# Patient Record
Sex: Male | Born: 1955 | Smoking: Current every day smoker
Health system: Southern US, Community
[De-identification: ages and names within clinical notes are randomized; demographics above are authoritative.]

---

## 2019-03-16 ENCOUNTER — Inpatient Hospital Stay
Admission: EM | Admit: 2019-03-16 | Discharge: 2019-03-31 | DRG: 871 | Disposition: E | Payer: Medicaid Other | Attending: Internal Medicine | Admitting: Internal Medicine

## 2019-03-16 ENCOUNTER — Emergency Department: Payer: Medicaid Other

## 2019-03-16 ENCOUNTER — Other Ambulatory Visit: Payer: Self-pay

## 2019-03-16 DIAGNOSIS — Z888 Allergy status to other drugs, medicaments and biological substances status: Secondary | ICD-10-CM

## 2019-03-16 DIAGNOSIS — R4182 Altered mental status, unspecified: Secondary | ICD-10-CM

## 2019-03-16 DIAGNOSIS — Z66 Do not resuscitate: Secondary | ICD-10-CM | POA: Diagnosis not present

## 2019-03-16 DIAGNOSIS — E872 Acidosis: Secondary | ICD-10-CM

## 2019-03-16 DIAGNOSIS — L03113 Cellulitis of right upper limb: Secondary | ICD-10-CM | POA: Diagnosis present

## 2019-03-16 DIAGNOSIS — G9341 Metabolic encephalopathy: Secondary | ICD-10-CM | POA: Diagnosis present

## 2019-03-16 DIAGNOSIS — E43 Unspecified severe protein-calorie malnutrition: Secondary | ICD-10-CM | POA: Diagnosis present

## 2019-03-16 DIAGNOSIS — K567 Ileus, unspecified: Secondary | ICD-10-CM

## 2019-03-16 DIAGNOSIS — M868X2 Other osteomyelitis, upper arm: Secondary | ICD-10-CM | POA: Diagnosis present

## 2019-03-16 DIAGNOSIS — R7881 Bacteremia: Secondary | ICD-10-CM

## 2019-03-16 DIAGNOSIS — T148XXA Other injury of unspecified body region, initial encounter: Secondary | ICD-10-CM

## 2019-03-16 DIAGNOSIS — R570 Cardiogenic shock: Secondary | ICD-10-CM | POA: Diagnosis present

## 2019-03-16 DIAGNOSIS — F17210 Nicotine dependence, cigarettes, uncomplicated: Secondary | ICD-10-CM | POA: Diagnosis present

## 2019-03-16 DIAGNOSIS — A4102 Sepsis due to Methicillin resistant Staphylococcus aureus: Secondary | ICD-10-CM | POA: Diagnosis present

## 2019-03-16 DIAGNOSIS — Q87410 Marfan's syndrome with aortic dilation: Secondary | ICD-10-CM

## 2019-03-16 DIAGNOSIS — R402412 Glasgow coma scale score 13-15, at arrival to emergency department: Secondary | ICD-10-CM | POA: Diagnosis present

## 2019-03-16 DIAGNOSIS — Z978 Presence of other specified devices: Secondary | ICD-10-CM

## 2019-03-16 DIAGNOSIS — Z01818 Encounter for other preprocedural examination: Secondary | ICD-10-CM

## 2019-03-16 DIAGNOSIS — Z79899 Other long term (current) drug therapy: Secondary | ICD-10-CM

## 2019-03-16 DIAGNOSIS — A0472 Enterocolitis due to Clostridium difficile, not specified as recurrent: Secondary | ICD-10-CM

## 2019-03-16 DIAGNOSIS — Z7189 Other specified counseling: Secondary | ICD-10-CM

## 2019-03-16 DIAGNOSIS — Z6823 Body mass index (BMI) 23.0-23.9, adult: Secondary | ICD-10-CM

## 2019-03-16 DIAGNOSIS — R64 Cachexia: Secondary | ICD-10-CM | POA: Diagnosis present

## 2019-03-16 DIAGNOSIS — I7103 Dissection of thoracoabdominal aorta: Secondary | ICD-10-CM | POA: Diagnosis present

## 2019-03-16 DIAGNOSIS — M069 Rheumatoid arthritis, unspecified: Secondary | ICD-10-CM | POA: Diagnosis present

## 2019-03-16 DIAGNOSIS — J9602 Acute respiratory failure with hypercapnia: Secondary | ICD-10-CM | POA: Diagnosis not present

## 2019-03-16 DIAGNOSIS — J690 Pneumonitis due to inhalation of food and vomit: Secondary | ICD-10-CM | POA: Diagnosis present

## 2019-03-16 DIAGNOSIS — R6521 Severe sepsis with septic shock: Secondary | ICD-10-CM | POA: Diagnosis present

## 2019-03-16 DIAGNOSIS — R627 Adult failure to thrive: Secondary | ICD-10-CM | POA: Diagnosis present

## 2019-03-16 DIAGNOSIS — L8990 Pressure ulcer of unspecified site, unspecified stage: Secondary | ICD-10-CM

## 2019-03-16 DIAGNOSIS — A419 Sepsis, unspecified organism: Secondary | ICD-10-CM

## 2019-03-16 DIAGNOSIS — Z1389 Encounter for screening for other disorder: Secondary | ICD-10-CM

## 2019-03-16 DIAGNOSIS — M00021 Staphylococcal arthritis, right elbow: Secondary | ICD-10-CM | POA: Diagnosis present

## 2019-03-16 DIAGNOSIS — F1020 Alcohol dependence, uncomplicated: Secondary | ICD-10-CM | POA: Diagnosis present

## 2019-03-16 DIAGNOSIS — D649 Anemia, unspecified: Secondary | ICD-10-CM | POA: Diagnosis present

## 2019-03-16 DIAGNOSIS — R52 Pain, unspecified: Secondary | ICD-10-CM

## 2019-03-16 DIAGNOSIS — Z0189 Encounter for other specified special examinations: Secondary | ICD-10-CM

## 2019-03-16 DIAGNOSIS — I5021 Acute systolic (congestive) heart failure: Secondary | ICD-10-CM | POA: Diagnosis not present

## 2019-03-16 DIAGNOSIS — H9213 Otorrhea, bilateral: Secondary | ICD-10-CM | POA: Diagnosis present

## 2019-03-16 DIAGNOSIS — Z8619 Personal history of other infectious and parasitic diseases: Secondary | ICD-10-CM

## 2019-03-16 DIAGNOSIS — Z452 Encounter for adjustment and management of vascular access device: Secondary | ICD-10-CM

## 2019-03-16 DIAGNOSIS — Z981 Arthrodesis status: Secondary | ICD-10-CM

## 2019-03-16 DIAGNOSIS — Z515 Encounter for palliative care: Secondary | ICD-10-CM

## 2019-03-16 DIAGNOSIS — R571 Hypovolemic shock: Secondary | ICD-10-CM | POA: Diagnosis present

## 2019-03-16 DIAGNOSIS — I083 Combined rheumatic disorders of mitral, aortic and tricuspid valves: Secondary | ICD-10-CM | POA: Diagnosis present

## 2019-03-16 DIAGNOSIS — Z20828 Contact with and (suspected) exposure to other viral communicable diseases: Secondary | ICD-10-CM | POA: Diagnosis present

## 2019-03-16 DIAGNOSIS — F1910 Other psychoactive substance abuse, uncomplicated: Secondary | ICD-10-CM | POA: Diagnosis present

## 2019-03-16 DIAGNOSIS — B9562 Methicillin resistant Staphylococcus aureus infection as the cause of diseases classified elsewhere: Secondary | ICD-10-CM

## 2019-03-16 DIAGNOSIS — N179 Acute kidney failure, unspecified: Secondary | ICD-10-CM

## 2019-03-16 DIAGNOSIS — J9601 Acute respiratory failure with hypoxia: Secondary | ICD-10-CM | POA: Diagnosis not present

## 2019-03-16 DIAGNOSIS — F10231 Alcohol dependence with withdrawal delirium: Secondary | ICD-10-CM | POA: Diagnosis not present

## 2019-03-16 DIAGNOSIS — J9600 Acute respiratory failure, unspecified whether with hypoxia or hypercapnia: Secondary | ICD-10-CM

## 2019-03-16 DIAGNOSIS — Z4659 Encounter for fitting and adjustment of other gastrointestinal appliance and device: Secondary | ICD-10-CM

## 2019-03-16 DIAGNOSIS — R6520 Severe sepsis without septic shock: Secondary | ICD-10-CM

## 2019-03-16 LAB — COMPREHENSIVE METABOLIC PANEL
ALT: 18 U/L (ref 0–44)
AST: 57 U/L — ABNORMAL HIGH (ref 15–41)
Albumin: 1.7 g/dL — ABNORMAL LOW (ref 3.5–5.0)
Alkaline Phosphatase: 123 U/L (ref 38–126)
Anion gap: 28 — ABNORMAL HIGH (ref 5–15)
BUN: 58 mg/dL — ABNORMAL HIGH (ref 8–23)
CO2: 11 mmol/L — ABNORMAL LOW (ref 22–32)
Calcium: 7.6 mg/dL — ABNORMAL LOW (ref 8.9–10.3)
Chloride: 92 mmol/L — ABNORMAL LOW (ref 98–111)
Creatinine, Ser: 4.6 mg/dL — ABNORMAL HIGH (ref 0.61–1.24)
GFR calc Af Amer: 15 mL/min — ABNORMAL LOW (ref >60)
GFR calc non Af Amer: 13 mL/min — ABNORMAL LOW (ref >60)
Glucose, Bld: 137 mg/dL — ABNORMAL HIGH (ref 70–99)
Potassium: 2.1 mmol/L — CL (ref 3.5–5.1)
Sodium: 131 mmol/L — ABNORMAL LOW (ref 135–145)
Total Bilirubin: 0.7 mg/dL (ref 0.3–1.2)
Total Protein: 5.1 g/dL — ABNORMAL LOW (ref 6.5–8.1)

## 2019-03-16 LAB — CBC
HCT: 23.5 % — ABNORMAL LOW (ref 39.0–52.0)
Hemoglobin: 8.2 g/dL — ABNORMAL LOW (ref 13.0–17.0)
MCH: 30.9 pg (ref 26.0–34.0)
MCHC: 34.9 g/dL (ref 30.0–36.0)
MCV: 88.7 fL (ref 80.0–100.0)
Platelets: 126 10*3/uL — ABNORMAL LOW (ref 150–400)
RBC: 2.65 MIL/uL — ABNORMAL LOW (ref 4.22–5.81)
RDW: 14.2 % (ref 11.5–15.5)
WBC: 24 10*3/uL — ABNORMAL HIGH (ref 4.0–10.5)
nRBC: 0 % (ref 0.0–0.2)

## 2019-03-16 LAB — CBC WITH DIFFERENTIAL/PLATELET
Abs Immature Granulocytes: 0.84 10*3/uL — ABNORMAL HIGH (ref 0.00–0.07)
Basophils Absolute: 0.1 10*3/uL (ref 0.0–0.1)
Basophils Relative: 0 %
Eosinophils Absolute: 0 10*3/uL (ref 0.0–0.5)
Eosinophils Relative: 0 %
HCT: 27.4 % — ABNORMAL LOW (ref 39.0–52.0)
Hemoglobin: 9.2 g/dL — ABNORMAL LOW (ref 13.0–17.0)
Immature Granulocytes: 3 %
Lymphocytes Relative: 2 %
Lymphs Abs: 0.6 10*3/uL — ABNORMAL LOW (ref 0.7–4.0)
MCH: 31.1 pg (ref 26.0–34.0)
MCHC: 33.6 g/dL (ref 30.0–36.0)
MCV: 92.6 fL (ref 80.0–100.0)
Monocytes Absolute: 0.5 10*3/uL (ref 0.1–1.0)
Monocytes Relative: 2 %
Neutro Abs: 23.5 10*3/uL — ABNORMAL HIGH (ref 1.7–7.7)
Neutrophils Relative %: 93 %
Platelets: 151 10*3/uL (ref 150–400)
RBC: 2.96 MIL/uL — ABNORMAL LOW (ref 4.22–5.81)
RDW: 14.3 % (ref 11.5–15.5)
Smear Review: ADEQUATE
WBC: 25.6 10*3/uL — ABNORMAL HIGH (ref 4.0–10.5)
nRBC: 0.2 % (ref 0.0–0.2)

## 2019-03-16 LAB — URINALYSIS, COMPLETE (UACMP) WITH MICROSCOPIC
Bilirubin Urine: NEGATIVE
Glucose, UA: NEGATIVE mg/dL
Ketones, ur: NEGATIVE mg/dL
Nitrite: NEGATIVE
Protein, ur: 100 mg/dL — AB
Specific Gravity, Urine: 1.017 (ref 1.005–1.030)
WBC, UA: >50 WBC/hpf — ABNORMAL HIGH (ref 0–5)
pH: 5 (ref 5.0–8.0)

## 2019-03-16 LAB — BASIC METABOLIC PANEL
Anion gap: 17 — ABNORMAL HIGH (ref 5–15)
BUN: 62 mg/dL — ABNORMAL HIGH (ref 8–23)
CO2: 17 mmol/L — ABNORMAL LOW (ref 22–32)
Calcium: 7 mg/dL — ABNORMAL LOW (ref 8.9–10.3)
Chloride: 99 mmol/L (ref 98–111)
Creatinine, Ser: 4.28 mg/dL — ABNORMAL HIGH (ref 0.61–1.24)
GFR calc Af Amer: 16 mL/min — ABNORMAL LOW (ref >60)
GFR calc non Af Amer: 14 mL/min — ABNORMAL LOW (ref >60)
Glucose, Bld: 135 mg/dL — ABNORMAL HIGH (ref 70–99)
Potassium: <2 mmol/L — CL (ref 3.5–5.1)
Sodium: 133 mmol/L — ABNORMAL LOW (ref 135–145)

## 2019-03-16 LAB — IRON AND TIBC: Iron: 14 ug/dL — ABNORMAL LOW (ref 45–182)

## 2019-03-16 LAB — FERRITIN: Ferritin: 1271 ng/mL — ABNORMAL HIGH (ref 24–336)

## 2019-03-16 LAB — URINE DRUG SCREEN, QUALITATIVE (ARMC ONLY)
Amphetamines, Ur Screen: NOT DETECTED
Barbiturates, Ur Screen: NOT DETECTED
Benzodiazepine, Ur Scrn: NOT DETECTED
Cannabinoid 50 Ng, Ur ~~LOC~~: POSITIVE — AB
Cocaine Metabolite,Ur ~~LOC~~: NOT DETECTED
MDMA (Ecstasy)Ur Screen: NOT DETECTED
Methadone Scn, Ur: NOT DETECTED
Opiate, Ur Screen: POSITIVE — AB
Phencyclidine (PCP) Ur S: NOT DETECTED
Tricyclic, Ur Screen: NOT DETECTED

## 2019-03-16 LAB — LACTIC ACID, PLASMA
Lactic Acid, Venous: >11 mmol/L (ref 0.5–1.9)
Lactic Acid, Venous: 5.7 mmol/L (ref 0.5–1.9)
Lactic Acid, Venous: 1.9 mmol/L (ref 0.5–1.9)

## 2019-03-16 LAB — GLUCOSE, CAPILLARY: Glucose-Capillary: 154 mg/dL — ABNORMAL HIGH (ref 70–99)

## 2019-03-16 LAB — MAGNESIUM
Magnesium: 2 mg/dL (ref 1.7–2.4)
Magnesium: 1.7 mg/dL (ref 1.7–2.4)

## 2019-03-16 LAB — SARS CORONAVIRUS 2 BY RT PCR (HOSPITAL ORDER, PERFORMED IN ~~LOC~~ HOSPITAL LAB): SARS Coronavirus 2: NEGATIVE

## 2019-03-16 LAB — BLOOD GAS, VENOUS
Patient temperature: 37
pCO2, Ven: 27 mmHg — ABNORMAL LOW (ref 44.0–60.0)
pH, Ven: 7.22 — ABNORMAL LOW (ref 7.250–7.430)
pO2, Ven: 34 mmHg (ref 32.0–45.0)

## 2019-03-16 LAB — MRSA PCR SCREENING: MRSA by PCR: POSITIVE — AB

## 2019-03-16 LAB — FOLATE: Folate: 4.5 ng/mL — ABNORMAL LOW (ref >5.9)

## 2019-03-16 LAB — PROCALCITONIN: Procalcitonin: 3.88 ng/mL

## 2019-03-16 MED ORDER — ACETAMINOPHEN 325 MG PO TABS: 650.0000 mg | ORAL_TABLET | Freq: Four times a day (QID) | ORAL | Status: DC | PRN

## 2019-03-16 MED ORDER — ONDANSETRON HCL 4 MG PO TABS: 4.0000 mg | ORAL_TABLET | Freq: Four times a day (QID) | ORAL | Status: DC | PRN

## 2019-03-16 MED ORDER — POTASSIUM CHLORIDE 10 MEQ/100ML IV SOLN
10.0000 meq | INTRAVENOUS | Status: AC
  Administered 2019-03-16 (×4): 10 meq via INTRAVENOUS
  Filled 2019-03-16 (×4): qty 100

## 2019-03-16 MED ORDER — ADULT MULTIVITAMIN W/MINERALS CH
1.0000 | ORAL_TABLET | Freq: Every day | ORAL | Status: DC
  Administered 2019-03-16: 1 via ORAL
  Filled 2019-03-16: qty 1

## 2019-03-16 MED ORDER — SODIUM CHLORIDE 0.9 % IV SOLN
2.0000 g | Freq: Once | INTRAVENOUS | Status: AC
  Administered 2019-03-16: 2 g via INTRAVENOUS
  Filled 2019-03-16: qty 2

## 2019-03-16 MED ORDER — LORAZEPAM 1 MG PO TABS: 1.0000 mg | ORAL_TABLET | Freq: Four times a day (QID) | ORAL | Status: DC | PRN

## 2019-03-16 MED ORDER — METRONIDAZOLE IN NACL 5-0.79 MG/ML-% IV SOLN
500.0000 mg | Freq: Once | INTRAVENOUS | Status: AC
  Administered 2019-03-16: 500 mg via INTRAVENOUS
  Filled 2019-03-16: qty 100

## 2019-03-16 MED ORDER — VANCOMYCIN HCL IN DEXTROSE 1-5 GM/200ML-% IV SOLN
1000.0000 mg | Freq: Once | INTRAVENOUS | Status: AC
  Administered 2019-03-16: 1000 mg via INTRAVENOUS
  Filled 2019-03-16: qty 200

## 2019-03-16 MED ORDER — ENOXAPARIN SODIUM 30 MG/0.3ML ~~LOC~~ SOLN
30.0000 mg | SUBCUTANEOUS | Status: DC
  Administered 2019-03-16: 30 mg via SUBCUTANEOUS
  Filled 2019-03-16: qty 0.3

## 2019-03-16 MED ORDER — THIAMINE HCL 100 MG/ML IJ SOLN: 100.0000 mg | Freq: Every day | INTRAMUSCULAR | Status: DC

## 2019-03-16 MED ORDER — SODIUM CHLORIDE 0.9 % IV BOLUS
500.0000 mL | Freq: Once | INTRAVENOUS | Status: AC
  Administered 2019-03-16: 500 mL via INTRAVENOUS

## 2019-03-16 MED ORDER — VANCOMYCIN HCL IN DEXTROSE 1-5 GM/200ML-% IV SOLN: 1000.0000 mg | Freq: Once | INTRAVENOUS | Status: DC

## 2019-03-16 MED ORDER — ONDANSETRON HCL 4 MG/2ML IJ SOLN: 4.0000 mg | Freq: Four times a day (QID) | INTRAMUSCULAR | Status: DC | PRN

## 2019-03-16 MED ORDER — MORPHINE SULFATE (PF) 2 MG/ML IV SOLN
1.0000 mg | INTRAVENOUS | Status: DC | PRN
  Administered 2019-03-16 – 2019-03-17 (×3): 2 mg via INTRAVENOUS
  Filled 2019-03-16 (×3): qty 1

## 2019-03-16 MED ORDER — FLUOXETINE HCL 20 MG PO CAPS
40.0000 mg | ORAL_CAPSULE | Freq: Every day | ORAL | Status: DC
  Filled 2019-03-16: qty 2

## 2019-03-16 MED ORDER — SODIUM CHLORIDE 0.9 % IV SOLN
2.0000 g | INTRAVENOUS | Status: DC
  Filled 2019-03-16: qty 2

## 2019-03-16 MED ORDER — SODIUM CHLORIDE 0.9 % IV SOLN
INTRAVENOUS | Status: DC
  Administered 2019-03-16 – 2019-03-17 (×2): via INTRAVENOUS

## 2019-03-16 MED ORDER — POTASSIUM CHLORIDE 10 MEQ/100ML IV SOLN
10.0000 meq | Freq: Once | INTRAVENOUS | Status: AC
  Administered 2019-03-16: 10 meq via INTRAVENOUS
  Filled 2019-03-16: qty 100

## 2019-03-16 MED ORDER — METOPROLOL SUCCINATE ER 50 MG PO TB24: 75.0000 mg | ORAL_TABLET | Freq: Every day | ORAL | Status: DC

## 2019-03-16 MED ORDER — PANTOPRAZOLE SODIUM 40 MG PO TBEC
40.0000 mg | DELAYED_RELEASE_TABLET | Freq: Every day | ORAL | Status: DC
  Administered 2019-03-16: 40 mg via ORAL
  Filled 2019-03-16: qty 1

## 2019-03-16 MED ORDER — SODIUM CHLORIDE 0.9 % IV SOLN
Freq: Once | INTRAVENOUS | Status: AC
  Administered 2019-03-16: 16:00:00 via INTRAVENOUS

## 2019-03-16 MED ORDER — VITAMIN B-1 100 MG PO TABS
100.0000 mg | ORAL_TABLET | Freq: Every day | ORAL | Status: DC
  Administered 2019-03-16: 100 mg via ORAL
  Filled 2019-03-16: qty 1

## 2019-03-16 MED ORDER — ACETAMINOPHEN 650 MG RE SUPP: 650.0000 mg | Freq: Four times a day (QID) | RECTAL | Status: DC | PRN

## 2019-03-16 MED ORDER — SODIUM CHLORIDE 0.9 % IV BOLUS
1000.0000 mL | Freq: Once | INTRAVENOUS | Status: AC
  Administered 2019-03-16: 13:00:00 1000 mL via INTRAVENOUS

## 2019-03-16 MED ORDER — VANCOMYCIN HCL IN DEXTROSE 1-5 GM/200ML-% IV SOLN
1000.0000 mg | INTRAVENOUS | Status: DC
  Administered 2019-03-17: 1000 mg via INTRAVENOUS
  Filled 2019-03-16: qty 200

## 2019-03-16 MED ORDER — LORAZEPAM 2 MG/ML IJ SOLN
1.0000 mg | Freq: Four times a day (QID) | INTRAMUSCULAR | Status: DC | PRN
  Administered 2019-03-18: 05:00:00 1 mg via INTRAVENOUS
  Filled 2019-03-16: qty 1

## 2019-03-16 MED ORDER — POLYETHYLENE GLYCOL 3350 17 G PO PACK
17.0000 g | PACK | Freq: Every day | ORAL | Status: DC | PRN
  Administered 2019-03-20: 03:00:00 17 g via ORAL
  Filled 2019-03-16: qty 1

## 2019-03-16 MED ORDER — FOLIC ACID 1 MG PO TABS
1.0000 mg | ORAL_TABLET | Freq: Every day | ORAL | Status: DC
  Administered 2019-03-16: 1 mg via ORAL
  Filled 2019-03-16: qty 1

## 2019-03-16 NOTE — ED Notes (Signed)
Pt urinated 75cc into urinal. Will send to lab.

## 2019-03-16 NOTE — ED Notes (Signed)
Trying to collect covid 19 swab but pt has lots of dried blood and unknown substances in both nares. Trying to clean them with basic swabs and NS without success.

## 2019-03-16 NOTE — ED Notes (Signed)
Pt shaking and cannot get clear EKG. Printed best one captured but still lots of artifact. Will print another once pt calmer. Two RN's attempting for cultures.

## 2019-03-16 NOTE — ED Notes (Signed)
Brooke, RN attempted for 2nd set of cultures at R fa.

## 2019-03-16 NOTE — ED Notes (Signed)
ED TO INPATIENT HANDOFF REPORT  ED Nurse Name and Phone #: Greta Doom [phone]  S Name/Age/Gender Leonard Peters 63 y.o. male Room/Bed: ED11A/ED11A  Code Status   Code Status: Not on file  Home/SNF/Other Home Patient oriented to: self and time Is this baseline? unsure  Triage Complete: Triage complete  Chief Complaint Shortness of Breath  Triage Note Pt in via EMS from home; EMs called for resp distress; pt AMS; RR 38; 116/74 BP; 120 HR; CBG 185; CO13 per EMS; temp 97.5 per EMS; pt unable to speak clearly; pt has sores/abrasions all over body; 18g L fa placed by EMS/fluids started with EMS; pt alert;  history: Staph infection being tx.    Allergies Not on File  Level of Care/Admitting Diagnosis ED Disposition    ED Disposition Condition Comment   Admit  Hospital Area: [facility] [[id number]]  Level of Care: Stepdown [14]  Covid Evaluation: N/A  Diagnosis: Sepsis [facility]) [[id number]]  Admitting Physician: Willadean Carol DODD [[id number]]  Attending Physician: Willadean Carol DODD [[id number]]  Estimated length of stay: past midnight tomorrow  Certification:: I certify this patient will need inpatient services for at least 2 midnights  PT Class (Do Not Modify): Inpatient [101]  PT Acc Code (Do Not Modify): Private [1]       B Medical/Surgery History History reviewed. No pertinent past medical history. History reviewed. No pertinent surgical history.   A IV Location/Drains/Wounds Patient Lines/Drains/Airways Status   Active Line/Drains/Airways    Name:   Placement date:   Placement time:   Site:   Days:   Peripheral IV 03/19/2019 Left Antecubital   03/22/2019    1212    Antecubital   less than 1   Peripheral IV 03/21/2019 Left Forearm   03/05/2019    1205    Forearm   less than 1   Peripheral IV 03/28/2019 Right Forearm   03/03/2019    1229    Forearm   less than 1          Intake/Output Last 24 hours No intake or output data in the 24 hours ending 03/28/2019  1634  Labs/Imaging Results for orders placed or performed during the hospital encounter of 03/20/2019 (from the past 48 hour(s))  Lactic acid, plasma     Status: Abnormal   Collection Time: 03/24/2019 12:16 PM  Result Value Ref Range   Lactic Acid, Venous >11.0 (HH) 0.5 - 1.9 mmol/L    Comment: CRITICAL RESULT CALLED TO, READ BACK BY AND VERIFIED WITH  GEORGIE Jerric Oyen AT 1249 03/01/2019 SDR Performed at [facility], [address] [place], [place] [address]   Comprehensive metabolic panel     Status: Abnormal   Collection Time: 03/24/2019 12:16 PM  Result Value Ref Range   Sodium 131 (L) 135 - 145 mmol/L    Comment: ELECTROLYTES REPEATED SDR   Potassium 2.1 (LL) 3.5 - 5.1 mmol/L    Comment: CRITICAL RESULT CALLED TO, READ BACK BY AND VERIFIED WITH:  GEORGIE Kameryn Tisdel AT 1259 03/05/2019 SDR    Chloride 92 (L) 98 - 111 mmol/L   CO2 11 (L) 22 - 32 mmol/L   Glucose, Bld 137 (H) 70 - 99 mg/dL   BUN 58 (H) 8 - 23 mg/dL   Creatinine, Ser 3.83 (H) 0.61 - 1.24 mg/dL   Calcium 7.6 (L) 8.9 - 10.3 mg/dL   Total Protein 5.1 (L) 6.5 - 8.1 g/dL   Albumin 1.7 (L) 3.5 - 5.0 g/dL   AST 57 (H)  15 - 41 U/L    Comment: RESULT CONFIRMED BY MANUAL DILUTION   ALT 18 0 - 44 U/L    Comment: RESULT CONFIRMED BY MANUAL DILUTION   Alkaline Phosphatase 123 38 - 126 U/L   Total Bilirubin 0.7 0.3 - 1.2 mg/dL   GFR calc non Af Amer 13 (L) >60 mL/min   GFR calc Af Amer 15 (L) >60 mL/min   Anion gap 28 (H) 5 - 15    Comment: Performed at [facility], [address] [place], [place] [address]  CBC WITH DIFFERENTIAL     Status: Abnormal   Collection Time: 03/30/2019 12:16 PM  Result Value Ref Range   WBC 25.6 (H) 4.0 - 10.5 K/uL   RBC 2.96 (L) 4.22 - 5.81 MIL/uL   Hemoglobin 9.2 (L) 13.0 - 17.0 g/dL   HCT 91.4 (L) 78.2 - 95.6 %   MCV 92.6 80.0 - 100.0 fL   MCH 31.1 26.0 - 34.0 pg   MCHC 33.6 30.0 - 36.0 g/dL   RDW 21.3 08.6 - 57.8 %   Platelets 151 150 - 400 K/uL   nRBC 0.2 0.0 - 0.2 %    Neutrophils Relative % 93 %   Neutro Abs 23.5 (H) 1.7 - 7.7 K/uL   Lymphocytes Relative 2 %   Lymphs Abs 0.6 (L) 0.7 - 4.0 K/uL   Monocytes Relative 2 %   Monocytes Absolute 0.5 0.1 - 1.0 K/uL   Eosinophils Relative 0 %   Eosinophils Absolute 0.0 0.0 - 0.5 K/uL   Basophils Relative 0 %   Basophils Absolute 0.1 0.0 - 0.1 K/uL   WBC Morphology TOXIC GRANULATION    RBC Morphology MORPHOLOGY UNREMARKABLE    Smear Review PLATELETS APPEAR ADEQUATE    Immature Granulocytes 3 %   Abs Immature Granulocytes 0.84 (H) 0.00 - 0.07 K/uL    Comment: Performed at [facility], [address] [place], [place] [address]  Procalcitonin     Status: None   Collection Time: 03/30/2019 12:16 PM  Result Value Ref Range   Procalcitonin 3.88 ng/mL    Comment:        Interpretation: PCT > 2 ng/mL: Systemic infection (sepsis) is likely, unless other causes are known. (NOTE)       Sepsis PCT Algorithm           Lower Respiratory Tract                                      Infection PCT Algorithm    ----------------------------     ----------------------------         PCT < 0.25 ng/mL                PCT < 0.10 ng/mL         Strongly encourage             Strongly discourage   discontinuation of antibiotics    initiation of antibiotics    ----------------------------     -----------------------------       PCT 0.25 - 0.50 ng/mL            PCT 0.10 - 0.25 ng/mL               OR       >80% decrease in PCT            Discourage initiation of  antibiotics      Encourage discontinuation           of antibiotics    ----------------------------     -----------------------------         PCT >= 0.50 ng/mL              PCT 0.26 - 0.50 ng/mL               AND       <80% decrease in PCT              Encourage initiation of                                             antibiotics       Encourage continuation           of antibiotics    ----------------------------      -----------------------------        PCT >= 0.50 ng/mL                  PCT > 0.50 ng/mL               AND         increase in PCT                  Strongly encourage                                      initiation of antibiotics    Strongly encourage escalation           of antibiotics                                     -----------------------------                                           PCT <= 0.25 ng/mL                                                 OR                                        > 80% decrease in PCT                                     Discontinue / Do not initiate                                             antibiotics Performed at [facility], [address] [place], [place] [address]   Urinalysis, Complete w Microscopic     Status: Abnormal   Collection Time:  2019/04/01 12:16 PM  Result Value Ref Range   Color, Urine AMBER (A) YELLOW    Comment: BIOCHEMICALS MAY BE AFFECTED BY COLOR   APPearance CLOUDY (A) CLEAR   Specific Gravity, Urine 1.017 1.005 - 1.030   pH 5.0 5.0 - 8.0   Glucose, UA NEGATIVE NEGATIVE mg/dL   Hgb urine dipstick MODERATE (A) NEGATIVE   Bilirubin Urine NEGATIVE NEGATIVE   Ketones, ur NEGATIVE NEGATIVE mg/dL   Protein, ur 488 (A) NEGATIVE mg/dL   Nitrite NEGATIVE NEGATIVE   Leukocytes,Ua LARGE (A) NEGATIVE   RBC / HPF 11-20 0 - 5 RBC/hpf   WBC, UA >50 (H) 0 - 5 WBC/hpf   Bacteria, UA RARE (A) NONE SEEN   Squamous Epithelial / LPF 0-5 0 - 5   WBC Clumps PRESENT    Mucus PRESENT    Non Squamous Epithelial PRESENT (A) NONE SEEN    Comment: Performed at [facility], [address] [place], [place] [address]  Blood gas, venous     Status: Abnormal   Collection Time: Apr 01, 2019 12:16 PM  Result Value Ref Range   pH, Ven 7.22 (L) 7.250 - 7.430   pCO2, Ven 27 (L) 44.0 - 60.0 mmHg   pO2, Ven 34.0 32.0 - 45.0 mmHg   Patient temperature 37.0    Collection site VENOUS    Sample type VENOUS     Comment: Performed at  [facility], [address] [place], [place] [address]  SARS Coronavirus 2 (CEPHEID- Performed in [facility] hospital lab), Hosp Order     Status: None   Collection Time: April 01, 2019 12:16 PM  Result Value Ref Range   SARS Coronavirus 2 NEGATIVE NEGATIVE    Comment: (NOTE) If result is NEGATIVE SARS-CoV-2 target nucleic acids are NOT DETECTED. The SARS-CoV-2 RNA is generally detectable in upper and lower  respiratory specimens during the acute phase of infection. The lowest  concentration of SARS-CoV-2 viral copies this assay can detect is 250  copies / mL. A negative result does not preclude SARS-CoV-2 infection  and should not be used as the sole basis for treatment or other  patient management decisions.  A negative result may occur with  improper specimen collection / handling, submission of specimen other  than nasopharyngeal swab, presence of viral mutation(s) within the  areas targeted by this assay, and inadequate number of viral copies  (<250 copies / mL). A negative result must be combined with clinical  observations, patient history, and epidemiological information. If result is POSITIVE SARS-CoV-2 target nucleic acids are DETECTED. The SARS-CoV-2 RNA is generally detectable in upper and lower  respiratory specimens dur ing the acute phase of infection.  Positive  results are indicative of active infection with SARS-CoV-2.  Clinical  correlation with patient history and other diagnostic information is  necessary to determine patient infection status.  Positive results do  not rule out bacterial infection or co-infection with other viruses. If result is PRESUMPTIVE POSTIVE SARS-CoV-2 nucleic acids MAY BE PRESENT.   A presumptive positive result was obtained on the submitted specimen  and confirmed on repeat testing.  While 2019 novel coronavirus  (SARS-CoV-2) nucleic acids may be present in the submitted sample  additional confirmatory testing may be necessary for  epidemiological  and / or clinical management purposes  to differentiate between  SARS-CoV-2 and other Sarbecovirus currently known to infect humans.  If clinically indicated additional testing with an alternate test  methodology [phone]) is advised. The SARS-CoV-2 RNA is generally  detectable in upper and lower respiratory sp ecimens during the acute  phase of infection. The expected result is Negative. Fact Sheet for Patients:  BoilerBrush.com.cyhttps://www.fda.gov/media/136312/download Fact Sheet for Healthcare Providers: https://pope.com/https://www.fda.gov/media/136313/download This test is not yet approved or cleared by the [place] FDA and has been authorized for detection and/or diagnosis of SARS-CoV-2 by FDA under an Emergency Use Authorization (EUA).  This EUA will remain in effect (meaning this test can be used) for the duration of the COVID-19 declaration under Section 564(b)(1) of the Act, 21 U.S.C. section 360bbb-3(b)(1), unless the authorization is terminated or revoked sooner. Performed at [facility], [address] [place], [place] [address]   Urine Drug Screen, Qualitative [facility] only)     Status: Abnormal   Collection Time: 02/28/2019 12:16 PM  Result Value Ref Range   Tricyclic, Ur Screen NONE DETECTED NONE DETECTED   Amphetamines, Ur Screen NONE DETECTED NONE DETECTED   MDMA (Ecstasy)Ur Screen NONE DETECTED NONE DETECTED   Cocaine Metabolite,Ur [place] NONE DETECTED NONE DETECTED   Opiate, Ur Screen POSITIVE (A) NONE DETECTED   Phencyclidine (PCP) Ur S NONE DETECTED NONE DETECTED   Cannabinoid 50 Ng, Ur  POSITIVE (A) NONE DETECTED   Barbiturates, Ur Screen NONE DETECTED NONE DETECTED   Benzodiazepine, Ur Scrn NONE DETECTED NONE DETECTED   Methadone Scn, Ur NONE DETECTED NONE DETECTED    Comment: (NOTE) Tricyclics + metabolites, urine    Cutoff 1000 ng/mL Amphetamines + metabolites, urine  Cutoff 1000 ng/mL MDMA (Ecstasy), urine              Cutoff 500 ng/mL Cocaine Metabolite,  urine          Cutoff 300 ng/mL Opiate + metabolites, urine        Cutoff 300 ng/mL Phencyclidine (PCP), urine         Cutoff 25 ng/mL Cannabinoid, urine                 Cutoff 50 ng/mL Barbiturates + metabolites, urine  Cutoff 200 ng/mL Benzodiazepine, urine              Cutoff 200 ng/mL Methadone, urine                   Cutoff 300 ng/mL The urine drug screen provides only a preliminary, unconfirmed analytical test result and should not be used for non-medical purposes. Clinical consideration and professional judgment should be applied to any positive drug screen result due to possible interfering substances. A more specific alternate chemical method must be used in order to obtain a confirmed analytical result. Gas chromatography / mass spectrometry (GC/MS) is the preferred confirmat ory method. Performed at [facility], [address] [place], [place] [address]   Lactic acid, plasma     Status: Abnormal   Collection Time: 03/08/2019  3:02 PM  Result Value Ref Range   Lactic Acid, Venous 5.7 (HH) 0.5 - 1.9 mmol/L    Comment: CRITICAL RESULT CALLED TO, READ BACK BY AND VERIFIED WITH Magnus IvanGEORGIE HAROLD RN AT 1550 03/10/2019.MSS Performed at [facility], [address] [place], [place] [address]   Magnesium     Status: None   Collection Time: 03/29/2019  3:12 PM  Result Value Ref Range   Magnesium 2.0 1.7 - 2.4 mg/dL    Comment: Performed at [facility], [address] [place], [place] [address]   Dg Elbow 2 Views Left  Result Date: 03/27/2019 CLINICAL DATA:  Pain with movement EXAM: LEFT  ELBOW - 2 VIEW COMPARISON:  None. FINDINGS: The joint effusion is identified with displacement of the anterior fat pad. No fractures is identified. No bony erosion or bony lesion. IMPRESSION: Joint effusion of uncertain etiology.  No bony abnormality noted. Electronically Signed   By: Gerome Sam III M.D   On: Apr 01, 2019 13:00   Dg Elbow 2 Views Right  Result  Date: 04/01/19 CLINICAL DATA:  Possible sepsis. Shortness of breath. Numerous festering wounds. Open wound posterior right elbow and at medial surface of right ankle. Pain in left elbow with movement. EXAM: RIGHT ELBOW - 2 VIEW COMPARISON:  None. FINDINGS: There is a wound over the olecranon. There is mild sclerosis in the underlying olecranon without identified bony erosion. There is suggestion of a possible small joint effusion. No fractures. IMPRESSION: 1. There is a deep wound posterior to the olecranon. Sclerosis in the underlying olecranon is identified without bony erosion. The sclerosis could represent sequela of previous osteomyelitis. An MRI could further assess if clinically warranted. 2. Suspected small joint effusion. 3. No fractures. Electronically Signed   By: Gerome Sam III M.D   On: 04/01/19 12:56   Dg Ankle 2 Views Right  Result Date: 2019/04/01 CLINICAL DATA:  Evaluate for sepsis. EXAM: RIGHT ANKLE - 2 VIEW COMPARISON:  None. FINDINGS: No fracture or dislocation. The ankle mortise is intact. Reported skin defect over the medial surface of the right ankle is not visualized on this study. No bony erosion or evidence of osteomyelitis. IMPRESSION: No evidence of osteomyelitis. Electronically Signed   By: Gerome Sam III M.D   On: 04/01/2019 12:59   Ct Head Wo Contrast  Result Date: 2019/04/01 CLINICAL DATA:  63 year old with unexplained acute mental status changes as the patient is unable to speak clearly. Patient also presents with respiratory distress and has widespread abrasions. EXAM: CT HEAD WITHOUT CONTRAST TECHNIQUE: Contiguous axial images were obtained from the base of the skull through the vertex without intravenous contrast. COMPARISON:  None. FINDINGS: Brain: Ventricular system normal in size and appearance for age. Very small old lacunar stroke in the LEFT thalamus. Mild changes of small vessel disease of the white matter diffusely. No mass lesion. No midline shift.  No acute hemorrhage or hematoma. No extra-axial fluid collections. No evidence of acute infarction. Vascular: Mild BILATERAL vertebral artery atherosclerosis. No hyperdense vessel. Skull: No skull fracture or other focal osseous abnormality involving the skull. Sinuses/Orbits: Mucous retention cyst or polyp in the RIGHT maxillary sinus. Remaining visualized paranasal sinuses, BILATERAL mastoid air cells and BILATERAL middle ear cavities well-aerated. Visualized orbits and globes unremarkable. Other: None. IMPRESSION: 1. No acute intracranial abnormality. 2. Mild chronic microvascular ischemic changes of the white matter and very small old lacunar stroke in the left thalamus. 3. Mild chronic right maxillary sinus disease. Electronically Signed   By: Hulan Saas M.D.   On: 04/01/2019 13:35   Dg Chest Port 1 View  Result Date: 04/01/2019 CLINICAL DATA:  Sepsis. EXAM: PORTABLE CHEST 1 VIEW COMPARISON:  None FINDINGS: The thoracic aorta is prominent tortuous. The heart, hila, and mediastinum are unremarkable. The left lung is clear. Mild opacity in the right mid lung. Mild atelectasis in the right base. No other acute abnormalities. IMPRESSION: 1. Possible mild opacity in the right mid lung could represent subtle pneumonia. A better volume PA and lateral chest x-ray may better evaluate. 2. Prominent tortuous thoracic aorta. Electronically Signed   By: Gerome Sam III M.D   On: Apr 01, 2019 12:58  Pending Labs Unresulted Labs (From admission, onward)    Start     Ordered   03/30/2019 1327  HIV antibody  Once,   STAT     03-30-2019 1326   03-30-2019 1202  Blood Culture (routine x 2)  BLOOD CULTURE X 2,   STAT     03-30-19 1202   2019-03-30 1202  Urine culture  ONCE - STAT,   STAT     Mar 30, 2019 1202   Signed and Held  Basic metabolic panel  Tomorrow morning,   R     Signed and Held   Signed and Held  CBC  Tomorrow morning,   R     Signed and Held   Signed and Held  Lactic acid, plasma  Once,   R      Signed and Held   Signed and Held  Magnesium  Add-on,   R     Signed and Held   Signed and Held  Ferritin  Once,   R     Signed and Held   Signed and Held  Iron and TIBC  Once,   R     Signed and Held   Signed and Held  Folate  Once,   R     Signed and Held   Signed and Held  Vitamin B12  Once,   R     Signed and Held          Vitals/Pain Today's Vitals   03/30/2019 1325 2019-03-30 1341 03/30/19 1343 03/30/2019 1345  BP:   128/61 (!) 142/65  Pulse:   96 95  Resp: (!) 30 (!) 22  (!) 25  Temp:      TempSrc:      SpO2:   100% 100%  Weight:      Height:      PainSc:        Isolation Precautions Droplet and Contact precautions  Medications Medications  potassium chloride 10 mEq in 100 mL IVPB (10 mEq Intravenous New Bag/Given 03-30-19 1632)  ceFEPIme (MAXIPIME) 2 g in sodium chloride 0.9 % 100 mL IVPB (0 g Intravenous Stopped 2019-03-30 1331)  metroNIDAZOLE (FLAGYL) IVPB 500 mg (0 mg Intravenous Stopped 30-Mar-2019 1357)  sodium chloride 0.9 % bolus 1,000 mL (1,000 mLs Intravenous New Bag/Given 03/30/2019 1254)  vancomycin (VANCOCIN) IVPB 1000 mg/200 mL premix (0 mg Intravenous Stopped Mar 30, 2019 1441)  sodium chloride 0.9 % bolus 500 mL (500 mLs Intravenous New Bag/Given 03/30/2019 1539)  0.9 %  sodium chloride infusion ( Intravenous New Bag/Given 03-30-2019 1537)    Mobility walks with person assist Moderate fall risk   Focused Assessments Neuro Assessment Handoff:  Swallow screen pass? Yes          Neuro Assessment:   Neuro Checks:      Last Documented NIHSS Modified Score:   Has TPA been given? No If patient is a Neuro Trauma and patient is going to OR before floor call report to 4N Charge nurse: [phone] or [phone]     R Recommendations: See Admitting Provider Note  Report given to:   Additional Notes:  Pt has 3 IV access points; has received 1.5L NS bolus for Lactic >11; full septic workup completed; pt has sores all over body; pt thinks he is at [facility] and doesn't  know exactly why; recent tx for staph infection per friend; NSR 80s; BP 125/64; RR 17; 100% RA; has received antibiotics.

## 2019-03-16 NOTE — Progress Notes (Signed)
Family friend Janie Morning at [phone] stated he is a good friend of the patient, and the patient confirmed, he is taking care of his dog, Sophie. He wanted to let us know that the patient's living condition is not good, he will not be able to return there once he is discharged, it is in disrepair and he is unable to take care of himself. He was informed that a care management consult will be placed to look into that for the patient. Patient allowed friend to give a password.

## 2019-03-16 NOTE — Consult Note (Signed)
Pharmacy Antibiotic Note  Leonard Peters is a 63 y.o. male admitted on 2019-04-15 with unknown source. Pharmacy has been consulted for Vancomycin/Cefepime dosing.  Plan: Pt received 1g Vancomycin IV in the ED, will start  Vancomycin 1000 mg IV Q 48 hrs. Goal AUC 400-550. Expected AUC: 481 SCr used: 4.6  Will start Cefepime 2g q 24h   Height: 6' (182.9 cm) Weight: 167 lb (75.8 kg) IBW/kg (Calculated) : 77.6  Temp (24hrs), Avg:97.8 F (36.6 C), Min:97.8 F (36.6 C), Max:97.8 F (36.6 C)  Recent Labs  Lab 15-Apr-2019 1216 Apr 15, 2019 1502  WBC 25.6*  --   CREATININE 4.60*  --   LATICACIDVEN >11.0* 5.7*    Estimated Creatinine Clearance: 17.6 mL/min (A) (by C-G formula based on SCr of 4.6 mg/dL (H)).    Not on File  Antimicrobials this admission: Vancomycin 5/17 >>  Cefepime 5/17 >>   Dose adjustments this admission: None  Microbiology results: 5/17 BCx: pending 5/17 UCx: pending    Thank you for allowing pharmacy to be a part of this patient's care.  Albina Billet, PharmD, BCPS Clinical Pharmacist 04/15/19 4:58 PM
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
## 2019-03-16 NOTE — ED Notes (Signed)
EDP Roxan Hockey notified in person of Lactic >11.
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
## 2019-03-16 NOTE — ED Notes (Signed)
1st set of cultures pulled by Shanda Bumps, RN off L ac.

## 2019-03-16 NOTE — ED Notes (Signed)
Unable to collect EKG yet d/t shaking causing lots of artifact.

## 2019-03-16 NOTE — ED Notes (Signed)
CT called to notify that pt is ready.

## 2019-03-16 NOTE — ED Notes (Signed)
EDP Roxan Hockey notified of Potassium 2.9.

## 2019-03-16 NOTE — H&P (Addendum)
[facility] [place] [facility]   PATIENT NAME: Leonard Peters    MR#:  [id number]  DATE OF BIRTH:  [birth year]  DATE OF ADMISSION:  Mar 24, 2019  PRIMARY CARE PHYSICIAN: No primary care provider on file.   REQUESTING/REFERRING PHYSICIAN: Willy Eddy, MD  CHIEF COMPLAINT:   Chief Complaint  Patient presents with  . Altered Mental Status    HISTORY OF PRESENT ILLNESS:  Leonard Peters  is a 63 y.o. male with no known PMH who presented to the ED with altered mental status.  History is obtained from the ED physician and through EMS, as patient is unable to provide much history.  Per EMS, he came from a house that was in very poor condition.  Patient's backpack was full of dog food and a sandwich.  Patient denies any chest pain.  He endorses shortness of breath for several months.  He states that he drinks alcohol, smokes cigarettes, and occasionally uses drugs.  He does not provide much detail past that.  In the ED, he was meeting sepsis criteria with heart rates in the 120s, RRs in the 20s, WBC 25.6, lactic acid >11.  Labs are significant for sodium 131, K 2.1, creatinine 4.60, procalcitonin 3.88, hemoglobin 9.2, AST 57.  Right ankle x-ray was negative.  Left elbow x-ray with joint effusion.  Left elbow x-ray with a deep wound posterior to the olecranon and bony sclerosis.  Chest x-ray with possible right midlung pneumonia. He was given IV fluids and started on empiric antibiotics.  Hospitalists were called for admission.  , PAST MEDICAL HISTORY:  Unable to obtain due to confusion  PAST SURGICAL HISTORY:  Unable to obtain due to confusion  SOCIAL HISTORY:   Social History   Tobacco Use  . Smoking status: Current Every Day Smoker  Substance Use Topics  . Alcohol use: Yes    Alcohol/week: 3.0 standard drinks    Types: 3 Cans of beer per week    FAMILY HISTORY:  Unable to obtain due to confusion  DRUG ALLERGIES:  Unable to obtain  REVIEW OF SYSTEMS:   ROS- unable to obtain due to confusion  MEDICATIONS AT HOME:   Prior to Admission medications   Not on File      VITAL SIGNS:  Blood pressure 128/61, pulse 96, temperature 97.8 F (36.6 C), temperature source Axillary, resp. rate (!) 22, height 6' (1.829 m), weight 75.8 kg, SpO2 100 %.  PHYSICAL EXAMINATION:  Physical Exam  GENERAL:  63 y.o.-year-old patient lying in the bed with no acute distress.  Disheveled and malnourished. EYES: Pupils equal, round, reactive to light and accommodation. No scleral icterus. Extraocular muscles intact.  HEENT: Head atraumatic, normocephalic. Oropharynx and nasopharynx clear.  NECK:  Supple, no jugular venous distention. No thyroid enlargement, no tenderness.  LUNGS: Normal breath sounds bilaterally, no wheezing, rales,rhonchi or crepitation. No use of accessory muscles of respiration.  CARDIOVASCULAR: Tachycardic, regular rhythm, S1, S2 normal. No murmurs, rubs, or gallops.  ABDOMEN: Soft, nontender, nondistended. Bowel sounds present. No organomegaly or mass.  EXTREMITIES: No pedal edema, cyanosis, or clubbing. + Left elbow swelling NEUROLOGIC: Cranial nerves II through XII are intact. Muscle strength 5/5 in all extremities. Sensation intact. Gait not checked.  PSYCHIATRIC: The patient is alert and oriented x 3.  SKIN: Multiple wounds present throughout his extremities.  See pictures below.               LABORATORY PANEL:   CBC Recent Labs  Lab Mar 24, 2019  1216  WBC 25.6*  HGB 9.2*  HCT 27.4*  PLT 151   ------------------------------------------------------------------------------------------------------------------  Chemistries  Recent Labs  Lab 05-12-2019 1216  NA 131*  K 2.1*  CL 92*  CO2 11*  GLUCOSE 137*  BUN 58*  CREATININE 4.60*  CALCIUM 7.6*  AST 57*  ALT 18  ALKPHOS 123  BILITOT 0.7   ------------------------------------------------------------------------------------------------------------------   Cardiac Enzymes No results for input(s): TROPONINI in the last 168 hours. ------------------------------------------------------------------------------------------------------------------  RADIOLOGY:  Dg Elbow 2 Views Left  Result Date: 16-Jan-2019 CLINICAL DATA:  Pain with movement EXAM: LEFT ELBOW - 2 VIEW COMPARISON:  None. FINDINGS: The joint effusion is identified with displacement of the anterior fat pad. No fractures is identified. No bony erosion or bony lesion. IMPRESSION: Joint effusion of uncertain etiology.  No bony abnormality noted. Electronically Signed   By: Gerome Samavid  Williams III M.D   On: 07-13-202020 13:00   Dg Elbow 2 Views Right  Result Date: 16-Jan-2019 CLINICAL DATA:  Possible sepsis. Shortness of breath. Numerous festering wounds. Open wound posterior right elbow and at medial surface of right ankle. Pain in left elbow with movement. EXAM: RIGHT ELBOW - 2 VIEW COMPARISON:  None. FINDINGS: There is a wound over the olecranon. There is mild sclerosis in the underlying olecranon without identified bony erosion. There is suggestion of a possible small joint effusion. No fractures. IMPRESSION: 1. There is a deep wound posterior to the olecranon. Sclerosis in the underlying olecranon is identified without bony erosion. The sclerosis could represent sequela of previous osteomyelitis. An MRI could further assess if clinically warranted. 2. Suspected small joint effusion. 3. No fractures. Electronically Signed   By: Gerome Samavid  Williams III M.D   On: 07-13-202020 12:56   Dg Ankle 2 Views Right  Result Date: 16-Jan-2019 CLINICAL DATA:  Evaluate for sepsis. EXAM: RIGHT ANKLE - 2 VIEW COMPARISON:  None. FINDINGS: No fracture or dislocation. The ankle mortise is intact. Reported skin defect over the medial surface of the right ankle is not visualized on this study. No bony erosion or evidence of osteomyelitis. IMPRESSION: No evidence of osteomyelitis. Electronically Signed   By: Gerome Samavid  Williams III M.D    On: 07-13-202020 12:59   Ct Head Wo Contrast  Result Date: 16-Jan-2019 CLINICAL DATA:  63 year old with unexplained acute mental status changes as the patient is unable to speak clearly. Patient also presents with respiratory distress and has widespread abrasions. EXAM: CT HEAD WITHOUT CONTRAST TECHNIQUE: Contiguous axial images were obtained from the base of the skull through the vertex without intravenous contrast. COMPARISON:  None. FINDINGS: Brain: Ventricular system normal in size and appearance for age. Very small old lacunar stroke in the LEFT thalamus. Mild changes of small vessel disease of the white matter diffusely. No mass lesion. No midline shift. No acute hemorrhage or hematoma. No extra-axial fluid collections. No evidence of acute infarction. Vascular: Mild BILATERAL vertebral artery atherosclerosis. No hyperdense vessel. Skull: No skull fracture or other focal osseous abnormality involving the skull. Sinuses/Orbits: Mucous retention cyst or polyp in the RIGHT maxillary sinus. Remaining visualized paranasal sinuses, BILATERAL mastoid air cells and BILATERAL middle ear cavities well-aerated. Visualized orbits and globes unremarkable. Other: None. IMPRESSION: 1. No acute intracranial abnormality. 2. Mild chronic microvascular ischemic changes of the white matter and very small old lacunar stroke in the left thalamus. 3. Mild chronic right maxillary sinus disease. Electronically Signed   By: Hulan Saashomas  Lawrence M.D.   On: 07-13-202020 13:35   [facility]  Result  Date: 03/22/2019 CLINICAL DATA:  Sepsis. EXAM: PORTABLE CHEST 1 VIEW COMPARISON:  None FINDINGS: The thoracic aorta is prominent tortuous. The heart, hila, and mediastinum are unremarkable. The left lung is clear. Mild opacity in the right mid lung. Mild atelectasis in the right base. No other acute abnormalities. IMPRESSION: 1. Possible mild opacity in the right mid lung could represent subtle pneumonia. A better volume PA and lateral  chest x-ray may better evaluate. 2. Prominent tortuous thoracic aorta. Electronically Signed   By: Gerome Samavid  Williams III M.D   On: 03/15/2019 12:58      IMPRESSION AND PLAN:   Sepsis- meeting sepsis criteria with tachycardia, tachypnea, leukocytosis, and lactic acidosis. May be due to pneumonia, left elbow osteomyelitis/septic arthritis, multiple skin wounds, endocarditis, UTI. -COVID negative -Continue broad-spectrum IV antibiotics -Blood and urine cultures ordered -IV fluids -ECHO ordered to look for vegetations -Would obtain ID consult in the morning (no ID physician here on the weekends)  Lactic acidosis-likely due to above -IV fluids -Trend lactic acid  Acute renal failure- unclear if patient has a history of chronic kidney disease -IV fluids -Avoid nephrotoxic agents -Renal ultrasound -Recheck creatinine in the morning  Left elbow effusion -Will obtain MRI left elbow to assess further  Hypokalemia/hyponatremia- may be due to malnutrition -Replete and recheck -Check magnesium -IVFs  Multiple skin wounds- located on the elbows, ankles, fingers, toes- may be Janeway lesions? -Wound care consult  Adult failure to thrive/cachexia -Check HIV -Dietician consult  Normocytic anemia- unclear baseline -Check anemia panel  Polysubstance abuse- states he drinks alcohol and uses drugs. Does not elaborate further. -Check UDS -Will place on CIWA  All the records are reviewed and case discussed with ED provider. Management plans discussed with the patient, family and they are in agreement.  CODE STATUS: Full code for now- unable to discuss with patient and no family listed in the chart  TOTAL TIME TAKING CARE OF THIS PATIENT: 45 minutes.    Jinny BlossomKaty D  M.D on 03/25/2019 at 1:53 PM  Between 7am to 6pm - Pager - [phone]  After 6pm go to [email or web address] - Scientist, research (life sciences)password [email or web address]  [facility]  Office  [phone]  CC: Primary care physician; No  primary care provider on file.   Note: This dictation was prepared with Dragon dictation along with smaller phrase technology. Any transcriptional errors that result from this process are unintentional.

## 2019-03-16 NOTE — ED Notes (Signed)
Pt leaving for imaging now.  

## 2019-03-16 NOTE — ED Notes (Signed)
Peri care provided by this RN and NT as pt had small BM on self. Pt's heels, elbows, arms, legs, sacrum, nose have sores. Heels and feet sores are deep red/purple. Elsewhere sores are pink/red/brown.

## 2019-03-16 NOTE — Consult Note (Signed)
CODE SEPSIS - PHARMACY COMMUNICATION  **Broad Spectrum Antibiotics should be administered within 1 hour of Sepsis diagnosis**  Time Code Sepsis Called/Page Received: 1202  Antibiotics Ordered: vancomycin, cefepime, flagyl  Time of 1st antibiotic administration: 1301  Additional action taken by pharmacy: Messaged RN via secure chat.   Ronnald Ramp ,PharmD Clinical Pharmacist  03/19/2019  1:10 PM

## 2019-03-16 NOTE — ED Triage Notes (Signed)
Pt in via EMS from home; EMs called for resp distress; pt AMS; RR 38; 116/74 BP; 120 HR; CBG 185; CO13 per EMS; temp 97.5 per EMS; pt unable to speak clearly; pt has sores/abrasions all over body; 18g L fa placed by EMS/fluids started with EMS; pt alert;  history: Staph infection being tx.

## 2019-03-16 NOTE — ED Notes (Signed)
EDP Roxan Hockey verbal to collect COVID 19 swab via throat d/t dried blood packed into nose.

## 2019-03-16 NOTE — ED Notes (Addendum)
Pt given more warm blankets. Repositioned in bed. Pt understands a urine sample is needed. Will attempt to use urinal hung on bed rail. Pt understands that if unable an I&O cath will need to be completed.

## 2019-03-16 NOTE — ED Notes (Signed)
Pt given cup of iced water as he passed his swallow screen and EDP Roxan Hockey approved it.

## 2019-03-16 NOTE — ED Notes (Signed)
Pt states he does not want CPR if needed. Pt states last drugs used were yesterday. Won't state what it was. Pt unsure of last time he had a drink.

## 2019-03-17 ENCOUNTER — Inpatient Hospital Stay: Payer: Medicaid Other

## 2019-03-17 ENCOUNTER — Inpatient Hospital Stay (HOSPITAL_COMMUNITY)
Admit: 2019-03-17 | Discharge: 2019-03-17 | Disposition: A | Discharge status: Home / Self Care | Payer: Medicaid Other | Attending: Internal Medicine | Admitting: Internal Medicine

## 2019-03-17 DIAGNOSIS — E876 Hypokalemia: Secondary | ICD-10-CM

## 2019-03-17 DIAGNOSIS — I2510 Atherosclerotic heart disease of native coronary artery without angina pectoris: Secondary | ICD-10-CM

## 2019-03-17 DIAGNOSIS — F1010 Alcohol abuse, uncomplicated: Secondary | ICD-10-CM

## 2019-03-17 DIAGNOSIS — R825 Elevated urine levels of drugs, medicaments and biological substances: Secondary | ICD-10-CM

## 2019-03-17 DIAGNOSIS — B9562 Methicillin resistant Staphylococcus aureus infection as the cause of diseases classified elsewhere: Secondary | ICD-10-CM

## 2019-03-17 DIAGNOSIS — A419 Sepsis, unspecified organism: Secondary | ICD-10-CM

## 2019-03-17 DIAGNOSIS — R7881 Bacteremia: Secondary | ICD-10-CM

## 2019-03-17 DIAGNOSIS — I7102 Dissection of abdominal aorta: Secondary | ICD-10-CM

## 2019-03-17 DIAGNOSIS — M069 Rheumatoid arthritis, unspecified: Secondary | ICD-10-CM

## 2019-03-17 DIAGNOSIS — Z888 Allergy status to other drugs, medicaments and biological substances status: Secondary | ICD-10-CM

## 2019-03-17 DIAGNOSIS — H938X3 Other specified disorders of ear, bilateral: Secondary | ICD-10-CM

## 2019-03-17 DIAGNOSIS — Z515 Encounter for palliative care: Secondary | ICD-10-CM

## 2019-03-17 DIAGNOSIS — Z8619 Personal history of other infectious and parasitic diseases: Secondary | ICD-10-CM

## 2019-03-17 DIAGNOSIS — E8809 Other disorders of plasma-protein metabolism, not elsewhere classified: Secondary | ICD-10-CM

## 2019-03-17 DIAGNOSIS — I10 Essential (primary) hypertension: Secondary | ICD-10-CM

## 2019-03-17 DIAGNOSIS — Z7189 Other specified counseling: Secondary | ICD-10-CM

## 2019-03-17 DIAGNOSIS — R6520 Severe sepsis without septic shock: Secondary | ICD-10-CM

## 2019-03-17 DIAGNOSIS — L8990 Pressure ulcer of unspecified site, unspecified stage: Secondary | ICD-10-CM

## 2019-03-17 DIAGNOSIS — Z981 Arthrodesis status: Secondary | ICD-10-CM

## 2019-03-17 DIAGNOSIS — R58 Hemorrhage, not elsewhere classified: Secondary | ICD-10-CM

## 2019-03-17 DIAGNOSIS — E871 Hypo-osmolality and hyponatremia: Secondary | ICD-10-CM

## 2019-03-17 DIAGNOSIS — A0472 Enterocolitis due to Clostridium difficile, not specified as recurrent: Secondary | ICD-10-CM

## 2019-03-17 DIAGNOSIS — Q8740 Marfan's syndrome, unspecified: Secondary | ICD-10-CM

## 2019-03-17 DIAGNOSIS — F17200 Nicotine dependence, unspecified, uncomplicated: Secondary | ICD-10-CM

## 2019-03-17 DIAGNOSIS — N179 Acute kidney failure, unspecified: Secondary | ICD-10-CM

## 2019-03-17 DIAGNOSIS — I96 Gangrene, not elsewhere classified: Secondary | ICD-10-CM

## 2019-03-17 LAB — BLOOD CULTURE ID PANEL (REFLEXED)

## 2019-03-17 LAB — BASIC METABOLIC PANEL
Anion gap: 14 (ref 5–15)
BUN: 60 mg/dL — ABNORMAL HIGH (ref 8–23)
CO2: 16 mmol/L — ABNORMAL LOW (ref 22–32)
Calcium: 7.2 mg/dL — ABNORMAL LOW (ref 8.9–10.3)
Chloride: 104 mmol/L (ref 98–111)
Creatinine, Ser: 3.56 mg/dL — ABNORMAL HIGH (ref 0.61–1.24)
GFR calc Af Amer: 20 mL/min — ABNORMAL LOW (ref >60)
GFR calc non Af Amer: 17 mL/min — ABNORMAL LOW (ref >60)
Glucose, Bld: 92 mg/dL (ref 70–99)
Potassium: 2.3 mmol/L — CL (ref 3.5–5.1)
Sodium: 134 mmol/L — ABNORMAL LOW (ref 135–145)

## 2019-03-17 LAB — CBC
HCT: 25 % — ABNORMAL LOW (ref 39.0–52.0)
Hemoglobin: 8.5 g/dL — ABNORMAL LOW (ref 13.0–17.0)
MCH: 30.5 pg (ref 26.0–34.0)
MCHC: 34 g/dL (ref 30.0–36.0)
MCV: 89.6 fL (ref 80.0–100.0)
Platelets: 115 10*3/uL — ABNORMAL LOW (ref 150–400)
RBC: 2.79 MIL/uL — ABNORMAL LOW (ref 4.22–5.81)
RDW: 14.3 % (ref 11.5–15.5)
WBC: 21 10*3/uL — ABNORMAL HIGH (ref 4.0–10.5)
nRBC: 0.1 % (ref 0.0–0.2)

## 2019-03-17 LAB — MAGNESIUM
Magnesium: 2.1 mg/dL (ref 1.7–2.4)
Magnesium: 2 mg/dL (ref 1.7–2.4)

## 2019-03-17 LAB — POTASSIUM
Potassium: 2.4 mmol/L — CL (ref 3.5–5.1)
Potassium: 2.8 mmol/L — ABNORMAL LOW (ref 3.5–5.1)

## 2019-03-17 LAB — ECHOCARDIOGRAM COMPLETE
Height: 75 in
Weight: 2229.29 oz

## 2019-03-17 LAB — PHOSPHORUS: Phosphorus: 4.3 mg/dL (ref 2.5–4.6)

## 2019-03-17 LAB — VITAMIN B12: Vitamin B-12: 1394 pg/mL — ABNORMAL HIGH (ref 180–914)

## 2019-03-17 MED ORDER — SODIUM CHLORIDE 0.9 % IV SOLN: INTRAVENOUS | Status: DC | PRN

## 2019-03-17 MED ORDER — ADULT MULTIVITAMIN LIQUID CH
15.0000 mL | Freq: Every day | ORAL | Status: DC
  Filled 2019-03-17: qty 15

## 2019-03-17 MED ORDER — PRO-STAT SUGAR FREE PO LIQD: 30.0000 mL | Freq: Every day | ORAL | Status: DC

## 2019-03-17 MED ORDER — STERILE WATER FOR INJECTION IV SOLN
INTRAVENOUS | Status: DC
  Administered 2019-03-17 – 2019-03-19 (×3): via INTRAVENOUS
  Filled 2019-03-17 (×8): qty 850

## 2019-03-17 MED ORDER — JUVEN PO PACK
1.0000 | PACK | Freq: Two times a day (BID) | ORAL | Status: DC
  Administered 2019-03-19 – 2019-03-22 (×8): 1

## 2019-03-17 MED ORDER — FAMOTIDINE 20 MG PO TABS
20.0000 mg | ORAL_TABLET | Freq: Every day | ORAL | Status: DC
  Administered 2019-03-17: 20 mg via ORAL
  Filled 2019-03-17: qty 1

## 2019-03-17 MED ORDER — POTASSIUM CHLORIDE CRYS ER 20 MEQ PO TBCR
40.0000 meq | EXTENDED_RELEASE_TABLET | ORAL | Status: DC
  Administered 2019-03-17: 16:00:00 40 meq via ORAL
  Filled 2019-03-17: qty 2

## 2019-03-17 MED ORDER — MAGNESIUM SULFATE 2 GM/50ML IV SOLN
2.0000 g | Freq: Once | INTRAVENOUS | Status: AC
  Administered 2019-03-17: 2 g via INTRAVENOUS
  Filled 2019-03-17: qty 50

## 2019-03-17 MED ORDER — VITAMIN C 500 MG PO TABS
500.0000 mg | ORAL_TABLET | Freq: Every day | ORAL | Status: DC
  Administered 2019-03-17: 16:00:00 500 mg via ORAL
  Filled 2019-03-17: qty 1

## 2019-03-17 MED ORDER — VITAL HIGH PROTEIN PO LIQD: 1000.0000 mL | ORAL | Status: DC

## 2019-03-17 MED ORDER — VITAL 1.5 CAL PO LIQD: 1000.0000 mL | ORAL | Status: DC

## 2019-03-17 MED ORDER — POTASSIUM CHLORIDE 20 MEQ/15ML (10%) PO SOLN
40.0000 meq | Freq: Once | ORAL | Status: AC
  Administered 2019-03-17: 20 meq via ORAL
  Filled 2019-03-17: qty 30

## 2019-03-17 MED ORDER — SODIUM CHLORIDE 0.9 % IV SOLN
100.0000 mg | Freq: Two times a day (BID) | INTRAVENOUS | Status: DC
  Administered 2019-03-17: 14:00:00 100 mg via INTRAVENOUS
  Filled 2019-03-17 (×2): qty 100

## 2019-03-17 MED ORDER — VANCOMYCIN 50 MG/ML ORAL SOLUTION
500.0000 mg | Freq: Four times a day (QID) | ORAL | Status: DC
  Administered 2019-03-17 – 2019-03-18 (×4): 500 mg via ORAL
  Filled 2019-03-17 (×6): qty 10

## 2019-03-17 MED ORDER — FLUOXETINE HCL 20 MG PO CAPS
40.0000 mg | ORAL_CAPSULE | Freq: Every day | ORAL | Status: DC
  Administered 2019-03-19 – 2019-03-22 (×4): 40 mg
  Filled 2019-03-17 (×6): qty 2

## 2019-03-17 MED ORDER — ADULT MULTIVITAMIN W/MINERALS CH
1.0000 | ORAL_TABLET | Freq: Every day | ORAL | Status: DC
  Administered 2019-03-17: 1 via ORAL

## 2019-03-17 MED ORDER — SODIUM CHLORIDE 0.9 % IV SOLN
2.0000 g | Freq: Every day | INTRAVENOUS | Status: DC
  Administered 2019-03-17: 16:00:00 2 g via INTRAVENOUS
  Filled 2019-03-17: qty 20
  Filled 2019-03-17: qty 2

## 2019-03-17 MED ORDER — POTASSIUM CHLORIDE 10 MEQ/100ML IV SOLN
10.0000 meq | INTRAVENOUS | Status: AC
  Administered 2019-03-17 – 2019-03-18 (×2): 10 meq via INTRAVENOUS
  Filled 2019-03-17 (×2): qty 100

## 2019-03-17 MED ORDER — POTASSIUM CHLORIDE CRYS ER 20 MEQ PO TBCR
40.0000 meq | EXTENDED_RELEASE_TABLET | ORAL | Status: DC
  Administered 2019-03-17: 21:00:00 20 meq via ORAL
  Filled 2019-03-17: qty 2

## 2019-03-17 MED ORDER — FAMOTIDINE IN NACL 20-0.9 MG/50ML-% IV SOLN
20.0000 mg | Freq: Once | INTRAVENOUS | Status: DC
  Filled 2019-03-17: qty 50

## 2019-03-17 MED ORDER — METRONIDAZOLE IN NACL 5-0.79 MG/ML-% IV SOLN
500.0000 mg | Freq: Three times a day (TID) | INTRAVENOUS | Status: DC
  Administered 2019-03-17 (×2): 500 mg via INTRAVENOUS
  Filled 2019-03-17 (×4): qty 100

## 2019-03-17 MED ORDER — MUPIROCIN 2 % EX OINT
1.0000 "application " | TOPICAL_OINTMENT | Freq: Two times a day (BID) | CUTANEOUS | Status: AC
  Administered 2019-03-17 – 2019-03-21 (×10): 1 via NASAL
  Filled 2019-03-17: qty 22

## 2019-03-17 MED ORDER — SODIUM CHLORIDE 0.9 % IV BOLUS
500.0000 mL | Freq: Once | INTRAVENOUS | Status: AC
  Administered 2019-03-17: 500 mL via INTRAVENOUS

## 2019-03-17 MED ORDER — FAMOTIDINE IN NACL 20-0.9 MG/50ML-% IV SOLN: 20.0000 mg | Freq: Every day | INTRAVENOUS | Status: DC

## 2019-03-17 MED ORDER — POTASSIUM CHLORIDE 10 MEQ/100ML IV SOLN
10.0000 meq | INTRAVENOUS | Status: AC
  Administered 2019-03-17 (×2): 10 meq via INTRAVENOUS
  Filled 2019-03-17 (×2): qty 100

## 2019-03-17 MED ORDER — FOLIC ACID 1 MG PO TABS: 1.0000 mg | ORAL_TABLET | Freq: Every day | ORAL | Status: DC

## 2019-03-17 MED ORDER — VANCOMYCIN HCL IN DEXTROSE 1-5 GM/200ML-% IV SOLN
1000.0000 mg | INTRAVENOUS | Status: DC
  Filled 2019-03-17: qty 200

## 2019-03-17 MED ORDER — HEPARIN SODIUM (PORCINE) 5000 UNIT/ML IJ SOLN
5000.0000 [IU] | Freq: Three times a day (TID) | INTRAMUSCULAR | Status: DC
  Administered 2019-03-17 – 2019-03-22 (×15): 5000 [IU] via SUBCUTANEOUS
  Filled 2019-03-17 (×15): qty 1

## 2019-03-17 MED ORDER — VITAMIN B-1 100 MG PO TABS: 100.0000 mg | ORAL_TABLET | Freq: Every day | ORAL | Status: DC

## 2019-03-17 MED ORDER — LACTATED RINGERS IV BOLUS
1000.0000 mL | Freq: Once | INTRAVENOUS | Status: AC
  Administered 2019-03-17: 1000 mL via INTRAVENOUS

## 2019-03-17 MED ORDER — POTASSIUM CHLORIDE 10 MEQ/100ML IV SOLN
10.0000 meq | INTRAVENOUS | Status: DC
  Filled 2019-03-17 (×6): qty 100

## 2019-03-17 MED ORDER — CHLORHEXIDINE GLUCONATE CLOTH 2 % EX PADS
6.0000 | MEDICATED_PAD | Freq: Every day | CUTANEOUS | Status: AC
  Administered 2019-03-17 – 2019-03-20 (×5): 6 via TOPICAL

## 2019-03-17 MED ORDER — SODIUM CHLORIDE 0.9 % IV BOLUS
1000.0000 mL | Freq: Once | INTRAVENOUS | Status: AC
  Administered 2019-03-17: 08:00:00 1000 mL via INTRAVENOUS

## 2019-03-17 MED ORDER — SALINE SPRAY 0.65 % NA SOLN
1.0000 | NASAL | Status: DC | PRN
  Filled 2019-03-17: qty 44

## 2019-03-17 MED ORDER — VANCOMYCIN 50 MG/ML ORAL SOLUTION
125.0000 mg | Freq: Four times a day (QID) | ORAL | Status: DC
  Filled 2019-03-17 (×3): qty 2.5

## 2019-03-17 MED ORDER — POTASSIUM CHLORIDE 10 MEQ/100ML IV SOLN
10.0000 meq | INTRAVENOUS | Status: AC
  Administered 2019-03-17 (×4): 10 meq via INTRAVENOUS
  Filled 2019-03-17 (×6): qty 100

## 2019-03-17 MED ORDER — SODIUM CHLORIDE 0.9 % IV SOLN
1.0000 g | Freq: Every day | INTRAVENOUS | Status: DC
  Filled 2019-03-17: qty 10

## 2019-03-17 NOTE — Progress Notes (Signed)
*  PRELIMINARY RESULTS* Echocardiogram 2D Echocardiogram has been performed.  Leonard Peters 03/17/2019, 9:34 AM

## 2019-03-17 NOTE — Progress Notes (Addendum)
Pharmacy Antibiotic Note  Leonard Peters is a 63 y.o. male admitted on 03/20/2019 with multiple infections. Patient with recent history of drug abuse and presents with clostridium difficile, MRSA bacteremia, cellulitis, UTI, and possible pneumonia. Patient with left heart catheterization and clostridium difficile infection in March 2020. Pharmacy has been consulted for vancomycin dosing.  Plan: Continue vancomycin 1g IV Q48hr. Will obtain serum creatinine with am labs. As creatinine clearance/serum creatinine improve, anticipate adjusting schedule. Plan for repeat blood cultures on 5/19.   Ceftriaxone 2g IV Q24hr and doxycycline 100mg  IV Q12hr to cover possible vibrio infection.    Vancomycin 500mg  PO Q6hr. Metronidazole 500mg  IV Q8hr added due to patient intermittently being unable to take oral medications.    Height: 6\' 3"  (190.5 cm) Weight: 139 lb 5.3 oz (63.2 kg) IBW/kg (Calculated) : 84.5  Temp (24hrs), Avg:97.5 F (36.4 C), Min:96.6 F (35.9 C), Max:97.9 F (36.6 C)  Recent Labs  Lab 2019/03/20 1216 03-20-19 1502 2019-03-20 1938 03/20/2019 2056 03/17/19 0538 03/17/19 0636  WBC 25.6*  --   --  24.0*  --  21.0*  CREATININE 4.60*  --  4.28*  --  3.56*  --   LATICACIDVEN >11.0* 5.7* 1.9  --   --   --     Estimated Creatinine Clearance: 19 mL/min (A) (by C-G formula based on SCr of 3.56 mg/dL (H)).    Allergies  Allergen Reactions  . Lipitor [Atorvastatin Calcium]     Elevated LFTs    Antimicrobials this admission: Cefepime 5/17 x 1 Metronidazole 5/17, 5/18 >>  Vancomycin IV 5/17 >>  Vancomycin PO 5/18 >> Ceftriaxone 5/18 >>  Doxycycline 5/18 >>   Dose adjustments this admission: 5/18 Vancomycin PO adjusted to 500mg  PO Q6hr.   Microbiology results: 5/17 BCx: MRSA  5/17 UCx: >100K staph aureus  5/17 MRSA PCR: positive  5/17 COVID: negative  5/18 Clostridium Difficile: positive   Thank you for allowing pharmacy to be a part of this patient's care.  Simpson,Michael  L 03/17/2019 2:21 PM

## 2019-03-17 NOTE — Consult Note (Signed)
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  During that hospitalization he also was positive for C. difficile.  He was evaluated by CT surgery and was determined to be elective aortic surgery and sent home to come back and have his ascending aortic replacement.  He presents to our ED brought in by the EMS as he was confused, incoherent and was covered from head to toe with wounds.   The vitals obtained by EMS was his respiratory rate was 38, blood pressure 116/74, heart rate of 120 and blood glucose of 185 and CO2 of 13.  There was a history of being treated for staph infection.   In the ED he was confused and unable to give a proper history.  His vitals were temperature of 97.8 blood pressure of 109/88.  He had CT of the head and blood culture was sent and other labs revealed a white count of 25.6, sodium of 131, potassium of 2.1, creatinine of 4.60.  SARS-CoV-2 was negative .  urine tox screen was positive for cannabinoids and opiates.  Chest x-ray  showed prominent aorta.  There was a question of a small infiltrate on the right side.  He was started on broad-spectrum antibiotics with vancomycin and cefepime.  He also tested positive for C. difficile and was started on oral vancomycin.  I am seeing the patient as the blood culture shows MRSA.   On reviewing [facility] records he was in hospital recently in March when [facility].  At that time he was found to be hypoxic with the 87% pulse ox, positive d-dimer.  [facility] was  [facility].  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  Sacrum and coccyx x-rays and CT were negative.  He was admitted to the TICU under the service of vascular for aortic dissection.  In preparation for surgery he underwent left heart catheterization which revealed  on 01/06/2019  mild non-flow-limiting coronary artery disease.  There was also small nondominant RCA.  There was proximal 30 to 40% LAD/diagonal stenosis.  LVEDP was 11mmHg.  He was determined to be elective aortic surgery and sent home to come back and have his ascending aorta replacement in 1 month.  Vascular surgery also set up an appointment in a month to see him for descending aorta dissection and RLE arterial obstruction.  He was also seen by dermatology for a new rash on his arms and legs and was given topical steroid.  He was seen by dental for very poor dentition and had multiple extraction on 01/16/2019 which he tolerated well.  For chronic leg weakness and recent fall he was seen by PT.  He is set up for home health PT.  He  was also counseled on smoking cessation as a strategy to reduce risk of cardiovascular disease and events.  He was treated for C. difficile colitis while in the hospital.  He also was found to have high creatinine of 2 on 01/04/2019 which on  01/16/2019 was 0.63. Patient failed to follow-up with them as outpatient.   ? Principal medical problems Aortic dissection thoracoabdominal aorta Inflammatory arthritis/rheumatoid arthritis Marfan syndrome Cervical myelopathy C. difficile colitis Hypertension Heart disease  Past surgical  history Cervical spine surgery arthrodesis anterior interbody INC discectomy cervical below C2 on 08/26/2015 with application of intervertebral biomechanical disease Dental extraction on 3  Family history Marfan syndrome father Sudden death Father  Social history current everyday smoker Alcohol use History of drug use Noted to be living in unsanitary condition. His backpack was full of dog food   Allergies  Allergen Reactions  . Lipitor [Atorvastatin Calcium]     Elevated LFTs   ? Current Facility-Administered Medications  Medication Dose Route Frequency Provider Last Rate Last Dose  . acetaminophen (TYLENOL) tablet 650 mg  650 mg Oral Q6H PRN Mayo, Allyn KennerKaty Dodd, MD       Or  . acetaminophen (TYLENOL) suppository 650 mg  650 mg Rectal Q6H PRN Mayo, Allyn KennerKaty Dodd, MD      . cefTRIAXone (ROCEPHIN) 1 g in sodium chloride 0.9 % 100 mL IVPB  1 g Intravenous Daily Erin FullingKasa, Kurian, MD      . Chlorhexidine Gluconate Cloth 2 % PADS 6 each  6 each Topical [id number] Erin FullingKasa, Kurian, MD   6 each at 03/17/19 [id number]  . doxycycline (VIBRAMYCIN) 100 mg in sodium chloride 0.9 % 250 mL IVPB  100 mg Intravenous BID Erin FullingKasa, Kurian, MD      . famotidine (PEPCID) IVPB 20 mg premix  20 mg Intravenous Once Erin FullingKasa, Kurian, MD      . Melene Muller[START ON 03/18/2019] FLUoxetine (PROZAC) capsule 40 mg  40 mg Per Tube Daily Erin FullingKasa, Kurian, MD      . Melene Muller[START ON 03/18/2019] folic acid (FOLVITE) tablet 1 mg  1 mg Per Tube Daily Kasa, Kurian, MD      . heparin injection 5,000 Units  5,000 Units Subcutaneous Q8H Kasa, Kurian, MD      . LORazepam (ATIVAN) tablet 1 mg  1 mg Oral Q6H PRN Mayo, Allyn KennerKaty Dodd, MD       Or  . LORazepam (ATIVAN) injection 1 mg  1 mg Intravenous Q6H PRN Mayo, Allyn KennerKaty Dodd, MD      . metroNIDAZOLE (FLAGYL) IVPB 500 mg  500 mg Intravenous Q8H Kasa, Kurian, MD      . morphine 2 MG/ML injection 1-2 mg  1-2 mg Intravenous Q2H PRN Erin FullingKasa, Kurian, MD   2 mg at 03/18/2019 2114  . multivitamin liquid 15 mL  15 mL Per Tube Daily Erin FullingKasa, Kurian, MD       . mupirocin ointment (BACTROBAN) 2 % 1 application  1 application Nasal BID Erin FullingKasa, Kurian, MD   1 application at 03/17/19 [phone]  . ondansetron (ZOFRAN) tablet 4 mg  4 mg Oral Q6H PRN Mayo, Allyn KennerKaty Dodd, MD       Or  . ondansetron [facility](ZOFRAN) injection 4 mg  4 mg Intravenous Q6H PRN Mayo, Allyn KennerKaty Dodd, MD      . polyethylene glycol (MIRALAX / GLYCOLAX) packet 17 g  17 g Oral Daily PRN Mayo, Allyn KennerKaty Dodd, MD      . sodium bicarbonate 150 mEq in sterile water 1,000 mL infusion   Intravenous Continuous Kasa,  Wallis Bamberg, MD      . Melene Muller ON 03/18/2019] thiamine (VITAMIN B-1) tablet 100 mg  100 mg Per Tube Daily Erin Fulling, MD      . vancomycin (VANCOCIN) 50 mg/mL oral solution 125 mg  125 mg Oral Q6H Scherrie Gerlach, [facility]      . vancomycin (VANCOCIN) IVPB 1000 mg/200 mL premix  1,000 mg Intravenous Q48H Shanlever, Charmayne Sheer, RPH         Abtx:  Anti-infectives (From admission, onward)   Start     Dose/Rate Route Frequency Ordered Stop   03/17/19 1200  ceFEPIme (MAXIPIME) 2 g in sodium chloride 0.9 % 100 mL IVPB  Status:  Discontinued     2 g 200 mL/hr over 30 Minutes Intravenous Every 24 hours 03/22/2019 1705 03/17/19 0915   03/17/19 1030  doxycycline (VIBRAMYCIN) 100 mg in sodium chloride 0.9 % 250 mL IVPB     100 mg 125 mL/hr over 120 Minutes Intravenous 2 times daily 03/17/19 1016     03/17/19 1030  cefTRIAXone (ROCEPHIN) 1 g in sodium chloride 0.9 % 100 mL IVPB     1 g 200 mL/hr over 30 Minutes Intravenous Daily 03/17/19 1016     03/17/19 1000  vancomycin (VANCOCIN) IVPB 1000 mg/200 mL premix     1,000 mg 200 mL/hr over 60 Minutes Intravenous Every 48 hours 03/11/2019 1705     03/17/19 1000  metroNIDAZOLE (FLAGYL) IVPB 500 mg     500 mg 100 mL/hr over 60 Minutes Intravenous Every 8 hours 03/17/19 0915     03/17/19 0700  vancomycin (VANCOCIN) 50 mg/mL oral solution 125 mg     125 mg Oral Every 6 hours 03/17/19 0659     Mar 23, 2019 1300  vancomycin (VANCOCIN) IVPB 1000 mg/200 mL premix     1,000 mg 200  mL/hr over 60 Minutes Intravenous  Once 03/18/2019 1245 Mar 23, 2019 1441   03/19/2019 1215  ceFEPIme (MAXIPIME) 2 g in sodium chloride 0.9 % 100 mL IVPB     2 g 200 mL/hr over 30 Minutes Intravenous  Once 23-Mar-2019 1202 03/02/2019 1331   03/18/2019 1215  metroNIDAZOLE (FLAGYL) IVPB 500 mg     500 mg 100 mL/hr over 60 Minutes Intravenous  Once 23-Mar-2019 1202 03/02/2019 1357   03/03/2019 1215  vancomycin (VANCOCIN) IVPB 1000 mg/200 mL premix  Status:  Discontinued     1,000 mg 200 mL/hr over 60 Minutes Intravenous  Once 03/22/2019 1202 Mar 23, 2019 1245      REVIEW OF SYSTEMS:  NA Objective:  VITALS:  BP (!) 93/53 (BP Location: Left Arm)   Pulse 92   Temp 97.6 F (36.4 C) (Axillary)   Resp 19   Ht  (1.905 m)   Wt 63.2 kg   SpO2 100%   BMI 17.42 kg/m  PHYSICAL EXAM:  General Awake, unable to understand him as speech not clear- seems to know the year and month Some distress, disheveled, emaciated  Head: Normocephalic, without obvious abnormality, atraumatic. Eyes: Conjunctivae clear, anicteric sclerae. Pupils are equal ENT purulent discharge ears, purpuric spots over his nose edentulous Neck: Supple,  Back: did not examine Lungs: b/l air entry Heart: s1s2 Abdomen: Soft, non-tender,not distended. Bowel sounds normal. No masses Extremities: multiple necrotic, blistering lesions fingers and toes            Elbow ecchymosis, necrotic area  Lymph: Cervical, supraclavicular normal. Neurologic: did not examine in detail Pertinent Labs Lab Results CBC    Component Value Date/Time   WBC  21.0 (H) 03/17/2019 0636   RBC 2.79 (L) 03/17/2019 0636   HGB 8.5 (L) 03/17/2019 0636   HCT 25.0 (L) 03/17/2019 0636   PLT 115 (L) 03/17/2019 0636   MCV 89.6 03/17/2019 0636   MCH 30.5 03/17/2019 0636   MCHC 34.0 03/17/2019 0636   RDW 14.3 03/17/2019 0636   LYMPHSABS 0.6 (L) 03/02/2019 1216   MONOABS 0.5 03/07/2019 1216   EOSABS 0.0 03/26/2019 1216   BASOSABS 0.1 03/09/2019 1216    CMP  Latest Ref Rng & Units 03/17/2019 03/22/2019 03/25/2019  Glucose 70 - 99 mg/dL 92 161(W) 960(A)  BUN 8 - 23 mg/dL 54(U) 98(J) 19(J)  Creatinine 0.61 - 1.24 mg/dL 4.78(G) 9.56(O) 1.30(Q)  Sodium 135 - 145 mmol/L 134(L) 133(L) 131(L)  Potassium 3.5 - 5.1 mmol/L 2.3(LL) <2.0(LL) 2.1(LL)  Chloride 98 - 111 mmol/L 104 99 92(L)  CO2 22 - 32 mmol/L 16(L) 17(L) 11(L)  Calcium 8.9 - 10.3 mg/dL 7.2(L) 7.0(L) 7.6(L)  Total Protein 6.5 - 8.1 g/dL - - 5.1(L)  Total Bilirubin 0.3 - 1.2 mg/dL - - 0.7  Alkaline Phos 38 - 126 U/L - - 123  AST 15 - 41 U/L - - 57(H)  ALT 0 - 44 U/L - - 18      Microbiology: Recent Results (from the past 240 hour(s))  Blood Culture (routine x 2)     Status: None (Preliminary result)   Collection Time: 03/07/2019 12:16 PM  Result Value Ref Range Status   Specimen Description BLOOD BLOOD RIGHT FOREARM  Final   Special Requests   Final    BOTTLES DRAWN AEROBIC AND ANAEROBIC Blood Culture results may not be optimal due to an excessive volume of blood received in culture bottles   Culture  Setup Time   Final    GRAM POSITIVE COCCI IN BOTH AEROBIC AND ANAEROBIC BOTTLES CRITICAL RESULT CALLED TO, READ BACK BY AND VERIFIED WITH: JASON ROBBINS ON 03/17/2019 AT 0012 QSD Performed at [facility], [address] [place], [place] [address]    Culture GRAM POSITIVE COCCI  Final   Report Status PENDING  Incomplete  Blood Culture (routine x 2)     Status: None (Preliminary result)   Collection Time: 03/02/2019 12:16 PM  Result Value Ref Range Status   Specimen Description BLOOD LAC  Final   Special Requests   Final    BOTTLES DRAWN AEROBIC AND ANAEROBIC Blood Culture results may not be optimal due to an excessive volume of blood received in culture bottles   Culture  Setup Time   Final    Organism ID to follow GRAM POSITIVE COCCI IN BOTH AEROBIC AND ANAEROBIC BOTTLES CRITICAL RESULT CALLED TO, READ BACK BY AND VERIFIED WITH: JASON ROBBINS ON 03/17/2019 AT 0012 QSD  Performed at [facility], [address], [place], [place] [address]    Culture [facility] POSITIVE COCCI  Final   Report Status PENDING  Incomplete  SARS Coronavirus 2 (CEPHEID- Performed in [facility] hospital lab), Hosp Order     Status: None   Collection Time: 03/29/2019 12:16 PM  Result Value Ref Range Status   SARS Coronavirus 2 NEGATIVE NEGATIVE Final    Comment: (NOTE) If result is NEGATIVE SARS-CoV-2 target nucleic acids are NOT DETECTED. The SARS-CoV-2 RNA is generally detectable in upper and lower  respiratory specimens during the acute phase of infection. The lowest  concentration of SARS-CoV-2 viral copies this assay can detect is 250  copies / mL. A negative result does not  preclude SARS-CoV-2 infection  and should not be used as the sole basis for treatment or other  patient management decisions.  A negative result may occur with  improper specimen collection / handling, submission of specimen other  than nasopharyngeal swab, presence of viral mutation(s) within the  areas targeted by this assay, and inadequate number of viral copies  (<250 copies / mL). A negative result must be combined with clinical  observations, patient history, and epidemiological information. If result is POSITIVE SARS-CoV-2 target nucleic acids are DETECTED. The SARS-CoV-2 RNA is generally detectable in upper and lower  respiratory specimens dur ing the acute phase of infection.  Positive  results are indicative of active infection with SARS-CoV-2.  Clinical  correlation with patient history and other diagnostic information is  necessary to determine patient infection status.  Positive results do  not rule out bacterial infection or co-infection with other viruses. If result is PRESUMPTIVE POSTIVE SARS-CoV-2 nucleic acids MAY BE PRESENT.   A presumptive positive result was obtained on the submitted specimen  and confirmed on repeat testing.  While 2019 novel coronavirus  (SARS-CoV-2)  nucleic acids may be present in the submitted sample  additional confirmatory testing may be necessary for epidemiological  and / or clinical management purposes  to differentiate between  SARS-CoV-2 and other Sarbecovirus currently known to infect humans.  If clinically indicated additional testing with an alternate test  methodology [phone]) is advised. The SARS-CoV-2 RNA is generally  detectable in upper and lower respiratory sp ecimens during the acute  phase of infection. The expected result is Negative. Fact Sheet for Patients:  BoilerBrush.com.cy Fact Sheet for Healthcare Providers: https://pope.com/ This test is not yet approved or cleared by the [place] FDA and has been authorized for detection and/or diagnosis of SARS-CoV-2 by FDA under an Emergency Use Authorization (EUA).  This EUA will remain in effect (meaning this test can be used) for the duration of the COVID-19 declaration under Section 564(b)(1) of the Act, 21 U.S.C. section 360bbb-3(b)(1), unless the authorization is terminated or revoked sooner. Performed at [facility], [address] [place], [place] [address]   Blood Culture ID Panel (Reflexed)     Status: Abnormal   Collection Time: 03/02/2019 12:16 PM  Result Value Ref Range Status   Enterococcus species NOT DETECTED NOT DETECTED Final   Listeria monocytogenes NOT DETECTED NOT DETECTED Final   Staphylococcus species DETECTED (A) NOT DETECTED Final    Comment: CRITICAL RESULT CALLED TO, READ BACK BY AND VERIFIED WITH: JASON ROBBINS ON 03/17/2019 AT 0012 QSD    Staphylococcus aureus (BCID) DETECTED (A) NOT DETECTED Final    Comment: Methicillin (oxacillin)-resistant Staphylococcus aureus (MRSA). MRSA is predictably resistant to beta-lactam antibiotics (except ceftaroline). Preferred therapy is vancomycin unless clinically contraindicated. Patient requires contact precautions if  hospitalized.  CRITICAL RESULT CALLED TO, READ BACK BY AND VERIFIED WITH: JASON ROBBINS ON 03/17/2019 AT 0012 QSD    Methicillin resistance DETECTED (A) NOT DETECTED Final    Comment: CRITICAL RESULT CALLED TO, READ BACK BY AND VERIFIED WITH: JASON ROBBINS ON 03/17/2019 AT 0012 QSD    Streptococcus species NOT DETECTED NOT DETECTED Final   Streptococcus agalactiae NOT DETECTED NOT DETECTED Final   Streptococcus pneumoniae NOT DETECTED NOT DETECTED Final   Streptococcus pyogenes NOT DETECTED NOT DETECTED Final   Acinetobacter baumannii NOT DETECTED NOT DETECTED Final   Enterobacteriaceae species NOT DETECTED NOT DETECTED Final   Enterobacter cloacae complex NOT DETECTED NOT DETECTED Final   Escherichia coli  NOT DETECTED NOT DETECTED Final   Klebsiella oxytoca NOT DETECTED NOT DETECTED Final   Klebsiella pneumoniae NOT DETECTED NOT DETECTED Final   Proteus species NOT DETECTED NOT DETECTED Final   Serratia marcescens NOT DETECTED NOT DETECTED Final   Haemophilus influenzae NOT DETECTED NOT DETECTED Final   Neisseria meningitidis NOT DETECTED NOT DETECTED Final   Pseudomonas aeruginosa NOT DETECTED NOT DETECTED Final   Candida albicans NOT DETECTED NOT DETECTED Final   Candida glabrata NOT DETECTED NOT DETECTED Final   Candida krusei NOT DETECTED NOT DETECTED Final   Candida parapsilosis NOT DETECTED NOT DETECTED Final   Candida tropicalis NOT DETECTED NOT DETECTED Final    Comment: Performed at [facility], [address] [place], [place] [address]  MRSA PCR Screening     Status: Abnormal   Collection Time: 03/30/19  6:16 PM  Result Value Ref Range Status   MRSA by PCR POSITIVE (A) NEGATIVE Final    Comment:        The GeneXpert MRSA Assay (FDA approved for NASAL specimens only), is one component of a comprehensive MRSA colonization surveillance program. It is not intended to diagnose MRSA infection nor to guide or monitor treatment for MRSA infections. RESULT CALLED TO, READ BACK  BY AND VERIFIED WITH: PREDOME,F AT 2001 ON 03-30-2019 BY MOSLEY,J Performed at [facility], [address] [place], [place] [address]   C difficile quick scan w PCR reflex     Status: Abnormal   Collection Time: 03/17/19  4:45 AM  Result Value Ref Range Status   C Diff antigen POSITIVE (A) NEGATIVE Final   C Diff toxin POSITIVE (A) NEGATIVE Final    Comment: RESULT CALLED TO, READ BACK BY AND VERIFIED WITH: FELICIA PREUDHOMME @0545  03/17/19 AKT    C Diff interpretation Toxin producing C. difficile detected.  Final    Comment: Performed at [facility], [address] [place], [place] [address]    IMAGING RESULTS: Possible mild opacity in the right mid lung could represent subtle pneumonia. A better volume PA and lateral chest x-ray may better evaluate Prominent tortuous thoracic aorta  I have personally reviewed the films ? 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  During that hospitalization he also was positive for C. difficile.  He was evaluated by CT surgery and was determined to be elective aortic surgery and sent home to come back and have his ascending aortic replacement.  He presents to our ED brought in by the EMS as he was confused, incoherent and was covered from head to toe with wounds.   ? ?MRSA bacteremia- has signs of embolic manifestation to the fingers and toes- janeway bodies?? 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 Plan was to do surgery at [facility] but he did not follow up Concern for infected thrombus   Marfans syndrome ( mentioned in [facility] record  Cdiff diarrhea- was treated in March for cdiff- positive here and on IV metronidazole- once PO allowed will have to go on Po vanco or PO fidoximicin  B/l purulent ear discharge- CT head did not reveal any acute abnormality- may need mastoid/inner ear focused films  Hypokalemia/ hyponatremia- could be related to alcohol abuse. being corrected  Hypoalbuminemia  HIV __test sent- pending  _______________________________________________ Discussed with nurse & requesting provider Note:  This document was prepared using Dragon voice recognition software and may include unintentional dictation errors.

## 2019-03-17 NOTE — Progress Notes (Signed)
Pharmacy Electrolyte Monitoring Consult:  Pharmacy consulted to assist in monitoring and replacing electrolytes in this 63 y.o. male admitted on 2019/03/29 with Altered Mental Status   Labs:  Sodium (mmol/L)  Date Value  03/17/2019 134 (L)   Potassium (mmol/L)  Date Value  03/17/2019 2.3 (LL)   Magnesium (mg/dL)  Date Value  79/12/8331 2.1   Phosphorus (mg/dL)  Date Value  83/29/1916 4.3   Calcium (mg/dL)  Date Value  60/60/0459 7.2 (L)   Albumin (g/dL)  Date Value  97/74/1423 1.7 (L)   Corrected calcium: 9  Assessment/Plan: Patient currently receiving sodium bicarbonate 143mEq/L at 159mL/hr.   Patient received potassium chloride IV Q1hr x 4 doses.   Will order potassium PO Q4hr x 2 doses and potassium chloride IV Q1hr x 2 doses.   Will check all electrolytes with am labs.   Will replace for goal potassium ~ 4 and goal magnesium ~ 2.   Pharmacy will continue to monitor and adjust per consult.   Lashanti Chambless L 03/17/2019 2:52 PM

## 2019-03-17 NOTE — Progress Notes (Signed)
[facility] - [place] [facility]   PATIENT NAME: Leonard Peters    MR#:  [id number]  DATE OF BIRTH:  08-18-1956  SUBJECTIVE:  CHIEF COMPLAINT:   Chief Complaint  Patient presents with  . Altered Mental Status  Patient seen and evaluated today Responds to verbal commands but has some confusion Generalized weakness  REVIEW OF SYSTEMS:    ROS Confused Unable to obtain complete review of systems  DRUG ALLERGIES:   Allergies  Allergen Reactions  . Lipitor [Atorvastatin Calcium]     Elevated LFTs    VITALS:  Blood pressure (!) 93/53, pulse 92, temperature 97.6 F (36.4 C), temperature source Axillary, resp. rate 19, height 6\' 3"  (1.905 m), weight 63.2 kg, SpO2 100 %.  PHYSICAL EXAMINATION:   Physical Exam  GENERAL:  63 y.o.-year-old patient lying in the bed  EYES: Pupils equal, round, reactive to light and accommodation. No scleral icterus. Extraocular muscles intact.  HEENT: Head atraumatic, normocephalic. Oropharynx and nasopharynx clear.  NECK:  Supple, no jugular venous distention. No thyroid enlargement, no tenderness.  LUNGS: Normal breath sounds bilaterally, no wheezing, rales, rhonchi. No use of accessory muscles of respiration.  CARDIOVASCULAR: S1, S2 normal. No murmurs, rubs, or gallops.  ABDOMEN: Soft, nontender, nondistended. Bowel sounds present. No organomegaly or mass.  EXTREMITIES: No cyanosis, clubbing or edema b/l.    NEUROLOGIC: Cranial nerves II through XII are intact. No focal Motor or sensory deficits b/l.   PSYCHIATRIC: The patient is alert and oriented x 1.  SKIN: No obvious rash, lesion, or ulcer.   LABORATORY PANEL:   CBC Recent Labs  Lab 03/17/19 0636  WBC 21.0*  HGB 8.5*  HCT 25.0*  PLT 115*   ------------------------------------------------------------------------------------------------------------------ Chemistries  Recent Labs  Lab 2019-04-15 1216  03/17/19 0538  NA 131*   < > 134*  K 2.1*   < > 2.3*  CL 92*    < > 104  CO2 11*   < > 16*  GLUCOSE 137*   < > 92  BUN 58*   < > 60*  CREATININE 4.60*   < > 3.56*  CALCIUM 7.6*   < > 7.2*  MG  --    < > 2.1  AST 57*  --   --   ALT 18  --   --   ALKPHOS 123  --   --   BILITOT 0.7  --   --    < > = values in this interval not displayed.   ------------------------------------------------------------------------------------------------------------------  Cardiac Enzymes No results for input(s): TROPONINI in the last 168 hours. ------------------------------------------------------------------------------------------------------------------  RADIOLOGY:  Dg Elbow 2 Views Left  Result Date: 2019-04-15 CLINICAL DATA:  Pain with movement EXAM: LEFT ELBOW - 2 VIEW COMPARISON:  None. FINDINGS: The joint effusion is identified with displacement of the anterior fat pad. No fractures is identified. No bony erosion or bony lesion. IMPRESSION: Joint effusion of uncertain etiology.  No bony abnormality noted. Electronically Signed   By: Gerome Sam III M.D   On: April 15, 2019 13:00   Dg Elbow 2 Views Right  Result Date: 04/15/2019 CLINICAL DATA:  Possible sepsis. Shortness of breath. Numerous festering wounds. Open wound posterior right elbow and at medial surface of right ankle. Pain in left elbow with movement. EXAM: RIGHT ELBOW - 2 VIEW COMPARISON:  None. FINDINGS: There is a wound over the olecranon. There is mild sclerosis in the underlying olecranon without identified bony erosion. There is suggestion of a possible small joint effusion.  No fractures. IMPRESSION: 1. There is a deep wound posterior to the olecranon. Sclerosis in the underlying olecranon is identified without bony erosion. The sclerosis could represent sequela of previous osteomyelitis. An MRI could further assess if clinically warranted. 2. Suspected small joint effusion. 3. No fractures. Electronically Signed   By: Gerome Samavid  Williams III M.D   On: Jul 19, 2019 12:56   Dg Ankle 2 Views  Right  Result Date: 2019/07/11 CLINICAL DATA:  Evaluate for sepsis. EXAM: RIGHT ANKLE - 2 VIEW COMPARISON:  None. FINDINGS: No fracture or dislocation. The ankle mortise is intact. Reported skin defect over the medial surface of the right ankle is not visualized on this study. No bony erosion or evidence of osteomyelitis. IMPRESSION: No evidence of osteomyelitis. Electronically Signed   By: Gerome Samavid  Williams III M.D   On: Jul 19, 2019 12:59   Ct Head Wo Contrast  Result Date: 2019/07/11 CLINICAL DATA:  63 year old with unexplained acute mental status changes as the patient is unable to speak clearly. Patient also presents with respiratory distress and has widespread abrasions. EXAM: CT HEAD WITHOUT CONTRAST TECHNIQUE: Contiguous axial images were obtained from the base of the skull through the vertex without intravenous contrast. COMPARISON:  None. FINDINGS: Brain: Ventricular system normal in size and appearance for age. Very small old lacunar stroke in the LEFT thalamus. Mild changes of small vessel disease of the white matter diffusely. No mass lesion. No midline shift. No acute hemorrhage or hematoma. No extra-axial fluid collections. No evidence of acute infarction. Vascular: Mild BILATERAL vertebral artery atherosclerosis. No hyperdense vessel. Skull: No skull fracture or other focal osseous abnormality involving the skull. Sinuses/Orbits: Mucous retention cyst or polyp in the RIGHT maxillary sinus. Remaining visualized paranasal sinuses, BILATERAL mastoid air cells and BILATERAL middle ear cavities well-aerated. Visualized orbits and globes unremarkable. Other: None. IMPRESSION: 1. No acute intracranial abnormality. 2. Mild chronic microvascular ischemic changes of the white matter and very small old lacunar stroke in the left thalamus. 3. Mild chronic right maxillary sinus disease. Electronically Signed   By: Hulan Saashomas  Lawrence M.D.   On: Jul 19, 2019 13:35   Dg Chest Port 1 View  Result Date:  2019/07/11 CLINICAL DATA:  Sepsis. EXAM: PORTABLE CHEST 1 VIEW COMPARISON:  None FINDINGS: The thoracic aorta is prominent tortuous. The heart, hila, and mediastinum are unremarkable. The left lung is clear. Mild opacity in the right mid lung. Mild atelectasis in the right base. No other acute abnormalities. IMPRESSION: 1. Possible mild opacity in the right mid lung could represent subtle pneumonia. A better volume PA and lateral chest x-ray may better evaluate. 2. Prominent tortuous thoracic aorta. Electronically Signed   By: Gerome Samavid  Williams III M.D   On: Jul 19, 2019 12:58   Dg Abd Portable 1v  Result Date: 03/17/2019 CLINICAL DATA:  Screening for metallic foreign bodies prior to MRI EXAM: PORTABLE ABDOMEN - 1 VIEW COMPARISON:  None. FINDINGS: The bowel gas pattern is normal. No radio-opaque calculi or other significant radiographic abnormality are seen. No definitive metallic foreign body is noted. Degenerative changes of lumbar spine are noted. IMPRESSION: No definitive metallic foreign body is identified. Electronically Signed   By: Alcide CleverMark  Lukens M.D.   On: 03/17/2019 10:08     ASSESSMENT AND PLAN:  63 year old male patient currently in the ICU for sepsis  -Sepsis IV fluids Broad-spectrum antibiotics Follow-up cultures IV pressors as needed Check hepatitis panel and HIV   -MRSA bacteremia Continue broad-spectrum antibiotics Will need TEE Follow-up echocardiogram results COVID test negative  -Right lung pneumonia  with opacity Continue IV antibiotics CT chest down the line  -Acute kidney injury Avoid nephrotoxic drugs IV fluids and monitor renal function  -Right elbow cellulitis Follow-up MRI to check for any osteomyelitis  -Adult failure to thrive Nutritional supplements  -C. difficile colitis Contact and enteric precautions IV fluids Continue Flagyl and vancomycin  -Hypotension secondary to sepsis IV fluids and IV pressors as needed   All the records are reviewed and  case discussed with Care Management/Social Worker. Management plans discussed with the patient, family and they are in agreement.  CODE STATUS: Full code  DVT Prophylaxis: SCDs  TOTAL TIME TAKING CARE OF THIS PATIENT: 37 minutes.   POSSIBLE D/C IN 2 to 3 DAYS, DEPENDING ON CLINICAL CONDITION.  Ihor Austin M.D on 03/17/2019 at 10:29 AM  Between 7am to 6pm - Pager - [phone]  After 6pm go to [email or web address] - password [email or web address]  [facility] [place] [facility]  Office  [phone]  CC: Primary care physician; System, Pcp Not In  Note: This dictation was prepared with Dragon dictation along with smaller phrase technology. Any transcriptional errors that result from this process are unintentional.

## 2019-03-17 NOTE — Progress Notes (Signed)
PHARMACY - PHYSICIAN COMMUNICATION CRITICAL VALUE ALERT - BLOOD CULTURE IDENTIFICATION (BCID)  Results for orders placed or performed during the hospital encounter of 03/19/19  Blood Culture ID Panel (Reflexed) (Collected: 03/19/2019 12:16 PM)  Result Value Ref Range   Enterococcus species NOT DETECTED NOT DETECTED   Listeria monocytogenes NOT DETECTED NOT DETECTED   Staphylococcus species DETECTED (A) NOT DETECTED   Staphylococcus aureus (BCID) DETECTED (A) NOT DETECTED   Methicillin resistance DETECTED (A) NOT DETECTED   Streptococcus species NOT DETECTED NOT DETECTED   Streptococcus agalactiae NOT DETECTED NOT DETECTED   Streptococcus pneumoniae NOT DETECTED NOT DETECTED   Streptococcus pyogenes NOT DETECTED NOT DETECTED   Acinetobacter baumannii NOT DETECTED NOT DETECTED   Enterobacteriaceae species NOT DETECTED NOT DETECTED   Enterobacter cloacae complex NOT DETECTED NOT DETECTED   Escherichia coli NOT DETECTED NOT DETECTED   Klebsiella oxytoca NOT DETECTED NOT DETECTED   Klebsiella pneumoniae NOT DETECTED NOT DETECTED   Proteus species NOT DETECTED NOT DETECTED   Serratia marcescens NOT DETECTED NOT DETECTED   Haemophilus influenzae NOT DETECTED NOT DETECTED   Neisseria meningitidis NOT DETECTED NOT DETECTED   Pseudomonas aeruginosa NOT DETECTED NOT DETECTED   Candida albicans NOT DETECTED NOT DETECTED   Candida glabrata NOT DETECTED NOT DETECTED   Candida krusei NOT DETECTED NOT DETECTED   Candida parapsilosis NOT DETECTED NOT DETECTED   Candida tropicalis NOT DETECTED NOT DETECTED    Name of physician (or Provider) Contacted:  Deterding   Changes to prescribed antibiotics required: MD wants to keep pt on current abx regimen, will let CCU MD make changes later on 5/18.   Fumio Vandam D 03/17/2019  12:53 AM

## 2019-03-17 NOTE — TOC Initial Note (Signed)
Transition of Care [facility]) - Initial/Assessment Note    Patient Details  Name: Leonard Peters MRN: [id number] Date of Birth: February 15, 1956  Transition of Care [facility]) CM/SW Contact:    Allayne Butcher, RN Phone Number: 03/17/2019, 4:54 PM  Clinical Narrative:                 Patient admitted with sepsis.  Per patient's friend Maddie he lives alone and has no family except he might have a cousin that lives in [place].  Patient does not drive and Maddie and her father Riki Rusk look in on the patient from time to time and help him out.  Patient's condition is critical with poor prognosis.  Patient has no insurance.  RNCM will cont to monitor patient condition.    Expected Discharge Plan: (poor patient prognosis at this time) Barriers to Discharge: Inadequate or no insurance, Continued Medical Work up, Unsafe home situation   Patient Goals and CMS Choice        Expected Discharge Plan and Services Expected Discharge Plan: (poor patient prognosis at this time) In-house Referral: Clinical Social Work, Hospice / Palliative Care Discharge Planning Services: CM Consult   Living arrangements for the past 2 months: Single Family Home                                      Prior Living Arrangements/Services Living arrangements for the past 2 months: Single Family Home Lives with:: Self Patient language and need for interpreter reviewed:: No        Need for Family Participation in Patient Care: Yes (Comment) Care giver support system in place?: No (comment)   Criminal Activity/Legal Involvement Pertinent to Current Situation/Hospitalization: No - Comment as needed  Activities of Daily Living      Permission Sought/Granted   Permission granted to share information with : Yes, Verbal Permission Granted  Share Information with NAME: Jimmy and or Maddie           Emotional Assessment Appearance:: Appears older than stated age, Disheveled, Other (Comment Required(wounds all  over) Attitude/Demeanor/Rapport: Lethargic Affect (typically observed): Unable to Assess   Alcohol / Substance Use: Illicit Drugs, Alcohol Use Psych Involvement: No (comment)  Admission diagnosis:  Open wound [T14.8XXA] Pain [R52] Severe sepsis with acute organ dysfunction (HCC) [A41.9, R65.20] Altered mental status, unspecified altered mental status type [R41.82] Patient Active Problem List   Diagnosis Date Noted  . Severe sepsis with acute organ dysfunction (HCC) 03/17/2019  . C. difficile colitis 03/17/2019  . MRSA bacteremia 03/17/2019  . Palliative care by specialist   . Goals of care, counseling/discussion   . AKI (acute kidney injury) (HCC)   . Sepsis (HCC) 03/22/2019  . Pressure injury of skin 2019-03-22   PCP:  System, Pcp Not In Pharmacy:   [id number], [place] - [address] [place] [address] Phone: [phone] Fax: [phone]     Social Determinants of Health (SDOH) Interventions    Readmission Risk Interventions No flowsheet data found.

## 2019-03-17 NOTE — Consult Note (Signed)
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  (+) MRSA and being treated currently.  Living in substandard housing and history if drug use a couple of days ago.   Pt's heels, elbows, arms, legs, sacrum, nose have sores. Heels and feet sores are deep red/purple. Perineal lesions are pink/red/brown Wound type: Infectious/inflammatory, full thickness Pressure Injury POA: NA Measurement: Scattered round purple blistering around 1 cm each.   Wound NKN:[id number] filled blistering.  Drainage (amount, consistency, odor) none noted Periwound:erythema Dressing procedure/placement/frequency: Cleanse blisters with soap and water.  Leave open to air.  If they begin to drain, cover with silicone foam dressing. Change every three days and PRN soilage.  Will not follow at this time.  Please re-consult if needed.  Maple Hudson MSN, RN, FNP-BC CWON Wound, Ostomy, Continence Nurse Pager [phone]

## 2019-03-17 NOTE — Progress Notes (Signed)
MEDICATION RELATED CONSULT NOTE - INITIAL   Pharmacy Consult for C diff management  Indication: C diff   Not on File  Patient Measurements: Height: 6\' 3"  (190.5 cm) Weight: 139 lb 5.3 oz (63.2 kg) IBW/kg (Calculated) : 84.5 Adjusted Body Weight:   Vital Signs: Temp: 97.7 F (36.5 C) (05/18 0400) Temp Source: Axillary (05/18 0400) BP: 86/53 (05/18 0600) Pulse Rate: 91 (05/18 0600) Intake/Output from previous day: 05/17 0701 - 05/18 0700 In: 2327.7 [I.V.:1267; IV Piggyback:1060.7] Out: -  Intake/Output from this shift: Total I/O In: 1948.2 [I.V.:980.9; IV Piggyback:967.3] Out: -   Labs: Recent Labs    02/28/2019 1216 03/30/2019 1512 03/25/2019 1938 03/30/2019 2056 03/17/19 0538 03/17/19 0636  WBC 25.6*  --   --  24.0*  --  21.0*  HGB 9.2*  --   --  8.2*  --  8.5*  HCT 27.4*  --   --  23.5*  --  25.0*  PLT 151  --   --  126*  --  115*  CREATININE 4.60*  --  4.28*  --  3.56*  --   MG  --  2.0 1.7  --   --   --   ALBUMIN 1.7*  --   --   --   --   --   PROT 5.1*  --   --   --   --   --   AST 57*  --   --   --   --   --   ALT 18  --   --   --   --   --   ALKPHOS 123  --   --   --   --   --   BILITOT 0.7  --   --   --   --   --    Estimated Creatinine Clearance: 19 mL/min (A) (by C-G formula based on SCr of 3.56 mg/dL (H)).   Microbiology: Recent Results (from the past 720 hour(s))  Blood Culture (routine x 2)     Status: None (Preliminary result)   Collection Time: 03/06/2019 12:16 PM  Result Value Ref Range Status   Specimen Description BLOOD BLOOD RIGHT FOREARM  Final   Special Requests   Final    BOTTLES DRAWN AEROBIC AND ANAEROBIC Blood Culture results may not be optimal due to an excessive volume of blood received in culture bottles   Culture  Setup Time   Final    GRAM POSITIVE COCCI AEROBIC BOTTLE ONLY CRITICAL RESULT CALLED TO, READ BACK BY AND VERIFIED WITH: Pookela Sellin ON 03/17/2019 AT 0012 QSD Performed at [facility], [address],  [place], [place] [address]    Culture GRAM POSITIVE COCCI  Final   Report Status PENDING  Incomplete  Blood Culture (routine x 2)     Status: None (Preliminary result)   Collection Time: 03/30/2019 12:16 PM  Result Value Ref Range Status   Specimen Description BLOOD LAC  Final   Special Requests   Final    BOTTLES DRAWN AEROBIC AND ANAEROBIC Blood Culture results may not be optimal due to an excessive volume of blood received in culture bottles   Culture  Setup Time   Final    Organism ID to follow GRAM POSITIVE COCCI IN BOTH AEROBIC AND ANAEROBIC BOTTLES CRITICAL RESULT CALLED TO, READ BACK BY AND VERIFIED WITH: Agapito Hanway ON 03/17/2019 AT 0012 QSD Performed at [facility], [address] [place], [place] [address]  Culture GRAM POSITIVE COCCI  Final   Report Status PENDING  Incomplete  SARS Coronavirus 2 (CEPHEID- Performed in [facility] hospital lab), Hosp Order     Status: None   Collection Time: 03/28/2019 12:16 PM  Result Value Ref Range Status   SARS Coronavirus 2 NEGATIVE NEGATIVE Final    Comment: (NOTE) If result is NEGATIVE SARS-CoV-2 target nucleic acids are NOT DETECTED. The SARS-CoV-2 RNA is generally detectable in upper and lower  respiratory specimens during the acute phase of infection. The lowest  concentration of SARS-CoV-2 viral copies this assay can detect is 250  copies / mL. A negative result does not preclude SARS-CoV-2 infection  and should not be used as the sole basis for treatment or other  patient management decisions.  A negative result may occur with  improper specimen collection / handling, submission of specimen other  than nasopharyngeal swab, presence of viral mutation(s) within the  areas targeted by this assay, and inadequate number of viral copies  (<250 copies / mL). A negative result must be combined with clinical  observations, patient history, and epidemiological information. If result is POSITIVE SARS-CoV-2 target nucleic acids  are DETECTED. The SARS-CoV-2 RNA is generally detectable in upper and lower  respiratory specimens dur ing the acute phase of infection.  Positive  results are indicative of active infection with SARS-CoV-2.  Clinical  correlation with patient history and other diagnostic information is  necessary to determine patient infection status.  Positive results do  not rule out bacterial infection or co-infection with other viruses. If result is PRESUMPTIVE POSTIVE SARS-CoV-2 nucleic acids MAY BE PRESENT.   A presumptive positive result was obtained on the submitted specimen  and confirmed on repeat testing.  While 2019 novel coronavirus  (SARS-CoV-2) nucleic acids may be present in the submitted sample  additional confirmatory testing may be necessary for epidemiological  and / or clinical management purposes  to differentiate between  SARS-CoV-2 and other Sarbecovirus currently known to infect humans.  If clinically indicated additional testing with an alternate test  methodology [phone]) is advised. The SARS-CoV-2 RNA is generally  detectable in upper and lower respiratory sp ecimens during the acute  phase of infection. The expected result is Negative. Fact Sheet for Patients:  BoilerBrush.com.cy Fact Sheet for Healthcare Providers: https://pope.com/ This test is not yet approved or cleared by the [place] FDA and has been authorized for detection and/or diagnosis of SARS-CoV-2 by FDA under an Emergency Use Authorization (EUA).  This EUA will remain in effect (meaning this test can be used) for the duration of the COVID-19 declaration under Section 564(b)(1) of the Act, 21 U.S.C. section 360bbb-3(b)(1), unless the authorization is terminated or revoked sooner. Performed at [facility], [address] [place], [place] [address]   Blood Culture ID Panel (Reflexed)     Status: Abnormal   Collection Time: 03/27/2019 12:16 PM   Result Value Ref Range Status   Enterococcus species NOT DETECTED NOT DETECTED Final   Listeria monocytogenes NOT DETECTED NOT DETECTED Final   Staphylococcus species DETECTED (A) NOT DETECTED Final    Comment: CRITICAL RESULT CALLED TO, READ BACK BY AND VERIFIED WITH: Chizuko Trine ON 03/17/2019 AT 0012 QSD    Staphylococcus aureus (BCID) DETECTED (A) NOT DETECTED Final    Comment: Methicillin (oxacillin)-resistant Staphylococcus aureus (MRSA). MRSA is predictably resistant to beta-lactam antibiotics (except ceftaroline). Preferred therapy is vancomycin unless clinically contraindicated. Patient requires contact precautions if  hospitalized. CRITICAL RESULT CALLED TO, READ BACK  BY AND VERIFIED WITH: Lucio Litsey ON 03/17/2019 AT 0012 QSD    Methicillin resistance DETECTED (A) NOT DETECTED Final    Comment: CRITICAL RESULT CALLED TO, READ BACK BY AND VERIFIED WITH: Simra Fiebig ON 03/17/2019 AT 0012 QSD    Streptococcus species NOT DETECTED NOT DETECTED Final   Streptococcus agalactiae NOT DETECTED NOT DETECTED Final   Streptococcus pneumoniae NOT DETECTED NOT DETECTED Final   Streptococcus pyogenes NOT DETECTED NOT DETECTED Final   Acinetobacter baumannii NOT DETECTED NOT DETECTED Final   Enterobacteriaceae species NOT DETECTED NOT DETECTED Final   Enterobacter cloacae complex NOT DETECTED NOT DETECTED Final   Escherichia coli NOT DETECTED NOT DETECTED Final   Klebsiella oxytoca NOT DETECTED NOT DETECTED Final   Klebsiella pneumoniae NOT DETECTED NOT DETECTED Final   Proteus species NOT DETECTED NOT DETECTED Final   Serratia marcescens NOT DETECTED NOT DETECTED Final   Haemophilus influenzae NOT DETECTED NOT DETECTED Final   Neisseria meningitidis NOT DETECTED NOT DETECTED Final   Pseudomonas aeruginosa NOT DETECTED NOT DETECTED Final   Candida albicans NOT DETECTED NOT DETECTED Final   Candida glabrata NOT DETECTED NOT DETECTED Final   Candida krusei NOT DETECTED NOT DETECTED  Final   Candida parapsilosis NOT DETECTED NOT DETECTED Final   Candida tropicalis NOT DETECTED NOT DETECTED Final    Comment: Performed at [facility], [address] [place], [place] [address]  MRSA PCR Screening     Status: Abnormal   Collection Time: 03/24/2019  6:16 PM  Result Value Ref Range Status   MRSA by PCR POSITIVE (A) NEGATIVE Final    Comment:        The GeneXpert MRSA Assay (FDA approved for NASAL specimens only), is one component of a comprehensive MRSA colonization surveillance program. It is not intended to diagnose MRSA infection nor to guide or monitor treatment for MRSA infections. RESULT CALLED TO, READ BACK BY AND VERIFIED WITH: PREDOME,F AT 2001 ON 03/15/2019 BY MOSLEY,J Performed at [facility], [address] [place], [place] [address]   C difficile quick scan w PCR reflex     Status: Abnormal   Collection Time: 03/17/19  4:45 AM  Result Value Ref Range Status   C Diff antigen POSITIVE (A) NEGATIVE Final   C Diff toxin POSITIVE (A) NEGATIVE Final    Comment: RESULT CALLED TO, READ BACK BY AND VERIFIED WITH: FELICIA PREUDHOMME @0545  03/17/19 AKT    C Diff interpretation Toxin producing C. difficile detected.  Final    Comment: Performed at [facility], [address] [place], [place] [address]    Medical History: History reviewed. No pertinent past medical history.  Medications:  Medications Prior to Admission  Medication Sig Dispense Refill Last Dose  . FLUoxetine (PROZAC) 40 MG capsule Take 40 mg by mouth daily.   Unknown at Unknown  . metoprolol succinate (TOPROL-XL) 50 MG 24 hr tablet Take 75 mg by mouth daily.   Unknown at Unknown  . omeprazole (PRILOSEC) 20 MG capsule Take 20 mg by mouth daily.   Unknown at Unknown  . oxyCODONE (OXY IR/ROXICODONE) 5 MG immediate release tablet Take 5 mg by mouth every 4 (four) hours as needed for pain.   Unknown at Unknown    Assessment: 5/18:   Positive result for C. Diff  toxin and C diff antigen   Goal of Therapy:  Eradication of C diff   Plan:  Will start Vancomycin 125 mg PO Q6H.   Leonard Peters D 03/17/2019,7:00 AM

## 2019-03-17 NOTE — Consult Note (Signed)
Consultation Note Date: 03/17/2019   Patient Name: Leonard Peters  DOB: Feb 07, 1956  MRN: [id number]  Age / Sex: 63 y.o., male  PCP: System, Pcp Not In Referring Physician: Saundra Shelling, MD  Reason for Consultation: Establishing goals of care  HPI/Patient Profile: 63 y.o. male  with past medical history of ETOH use, smoker, hypertension, Marfan syndrome, rheumatoid arthritis admitted on 03/02/2019 with altered mental status and appearing severely malnourished and cachectic. 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 Positive for cdiff. Evaluated by CT surgery with plans for elective aortic surgery, that has been postponed due to Covid 19. In ED, Extensive chronic wounds and blistering lesions on all extremities. ICU admission with severe sepsis with right mid opacity likely pneumonia (? Mass--needs CT chest), left elbow effusion, acute renal failure, malnourishment and failure to thrive. Found to have c.difficile colitis and MRSA bacteremia. ID consultation pending. Palliative medicine consultation for goals of care.   Clinical Assessment and Goals of Care:  I have reviewed medical records, discussed with care team, and met with patient at bedside x2 today. Patient is awake, alert, and oriented. Complains of generalized pain (RN gave prn morphine) and requesting water (RN to address with attending). He does have periods of forgetfulness, but able to participate in conversation.  Introduced Palliative Medicine as specialized medical care for people living with serious illness. It focuses on providing relief from the symptoms and stress of a serious illness. The goal is to improve quality of life for both the patient and the family.  We discussed a brief life review of the patient. Leonard Peters shares that his wife died this  past Nov 03, 2023. Lack of support system--speaks of a neighbor/friend Leonard Peters) but shares that Leonard Peters has a big family to take care of. Leonard Peters also mentions a cousin who occasionally calls to check in. Leonard Peters reports depression since the lose of his wife. PCP prescribed Prozac. His dog helps him cope and gives him joy.   Leonard Peters also reports a general decline in his health since a neck surgery about 2 years ago.   We discussed his most recent hospitalization at [facility] and plans for elective surgery that was cancelled due to corona virus. He mentions rescheduled for June? Since discharge back home, patient reports ongoing decline in functional and nutritional status. He shares his belief that he may not have gotten this sick if never discharged home from [facility] prior to the need for surgery.  Explored knowledge of multiple wounds and blisters. He claims to only having these for about one week.   Discussed events leading up to admission and course of hospitalization including diagnoses and interventions. Explained plan of care and pending ID consult.   I attempted to elicit values and goals of care important to the patient. Advanced directives and concepts specific to code status were discussed. Patient shares that his father died of Marfan's syndrome in his early 74's. Patient mentions his surprise that he has lived longer than  his father. Patient does not have a documented living will but shares that he would wish for [place] to contact neighbor Leonard Peters) if he was unable to make decisions for himself. Briefly introduced code status and his thoughts on heroic medical interventions but patient changes the subject and asks for water again.  Questions and concerns were addressed. Patient requests PMT provider continue to visit and provide support. He is hoping prn morphine will help his pain and help him rest.   SUMMARY OF RECOMMENDATIONS    Continue FULL code/FULL scope treatment.  ID consult pending  Poor  support system. Wife died in November 20, 2023. Patient approves emergency contact as neighbor/friend Leonard Peters).  PMT provider will continue to follow for support and ongoing [place] discussions. Needs follow-up advance directive/MOST form discussion.   Code Status/Advance Care Planning:  Full code  Symptom Management:   Per attending  Palliative Prophylaxis:   Aspiration, Delirium Protocol, Frequent Pain Assessment, Oral Care and Turn Reposition  Psycho-social/Spiritual:   Desire for further Chaplaincy support:yes  Additional Recommendations: Caregiving  Support/Resources  Prognosis:   Guarded to poor prognosis  Discharge Planning: To Be Determined      Primary Diagnoses: Present on Admission: . Sepsis ([place])   I have reviewed the medical record, interviewed the patient and family, and examined the patient. The following aspects are pertinent.  History reviewed. No pertinent past medical history. Social History   Socioeconomic History  . Marital status: Unknown    Spouse name: Not on file  . Number of children: Not on file  . Years of education: Not on file  . Highest education level: Not on file  Occupational History  . Not on file  Social Needs  . Financial resource strain: Not on file  . Food insecurity:    Worry: Not on file    Inability: Not on file  . Transportation needs:    Medical: Not on file    Non-medical: Not on file  Tobacco Use  . Smoking status: Current Every Day Smoker  Substance and Sexual Activity  . Alcohol use: Yes    Alcohol/week: 3.0 standard drinks    Types: 3 Cans of beer per week  . Drug use: Yes    Types: Marijuana  . Sexual activity: Not on file  Lifestyle  . Physical activity:    Days per week: Not on file    Minutes per session: Not on file  . Stress: Not on file  Relationships  . Social connections:    Talks on phone: Not on file    Gets together: Not on file    Attends religious service: Not on file    Active member  of club or organization: Not on file    Attends meetings of clubs or organizations: Not on file    Relationship status: Not on file  Other Topics Concern  . Not on file  Social History Narrative  . Not on file   History reviewed. No pertinent family history. Scheduled Meds: . Chlorhexidine Gluconate Cloth  6 each Topical Q0600  . famotidine  20 mg Oral Daily  . feeding supplement (PRO-STAT SUGAR FREE 64)  30 mL Per Tube Daily  . [START ON 03/18/2019] FLUoxetine  40 mg Per Tube Daily  . [START ON 4/81/8563] folic acid  1 mg Per Tube Daily  . heparin injection (subcutaneous)  5,000 Units Subcutaneous Q8H  . multivitamin  15 mL Per Tube Daily  . mupirocin ointment  1 application Nasal BID  . [  START ON 03/18/2019] nutrition supplement (JUVEN)  1 packet Per Tube BID BM  . potassium chloride  40 mEq Oral Q4H  . [START ON 03/18/2019] thiamine  100 mg Per Tube Daily  . vancomycin  500 mg Oral Q6H  . vitamin C  500 mg Oral Daily   Continuous Infusions: . cefTRIAXone (ROCEPHIN)  IV 2 g (03/17/19 1542)  . feeding supplement (VITAL 1.5 CAL)    . metronidazole    . potassium chloride    .  sodium bicarbonate (isotonic) infusion in sterile water 100 mL/hr at 03/17/19 1523  . [START ON 03/19/2019] vancomycin     PRN Meds:.acetaminophen **OR** acetaminophen, LORazepam **OR** LORazepam, morphine injection, ondansetron **OR** ondansetron (ZOFRAN) IV, polyethylene glycol Medications Prior to Admission:  Prior to Admission medications   Medication Sig Start Date End Date Taking? Authorizing Provider  FLUoxetine (PROZAC) 40 MG capsule Take 40 mg by mouth daily.   Yes [provider]  metoprolol succinate (TOPROL-XL) 50 MG 24 hr tablet Take 75 mg by mouth daily. 01/17/19  Yes [provider]  omeprazole (PRILOSEC) 20 MG capsule Take 20 mg by mouth daily. 11/13/18  Yes [provider]  oxyCODONE (OXY IR/ROXICODONE) 5 MG immediate release tablet Take 5 mg by mouth every 4 (four)  hours as needed for pain.   Yes [provider]   Allergies  Allergen Reactions  . Lipitor [Atorvastatin Calcium]     Elevated LFTs   Review of Systems  Constitutional: Positive for activity change, appetite change and unexpected weight change.  Respiratory: Positive for shortness of breath.   Neurological: Positive for weakness.   Physical Exam Vitals signs and nursing note reviewed.  Constitutional:      General: He is awake.     Appearance: He is cachectic. He is ill-appearing.  HENT:     Head: Normocephalic and atraumatic.  Cardiovascular:     Rate and Rhythm: Normal rate.  Pulmonary:     Effort: No tachypnea, accessory muscle usage or respiratory distress.  Abdominal:     Tenderness: There is no abdominal tenderness.  Skin:    General: Skin is warm and dry.     Comments: Multiple scattered wounds  Neurological:     Mental Status: He is alert.     Comments: Awake, alert, oriented to self and place.    Vital Signs: BP (!) 93/53 (BP Location: Left Arm)   Pulse 92   Temp 97.6 F (36.4 C) (Axillary)   Resp 19   Ht '6\' 3"'  (1.905 m)   Wt 63.2 kg   SpO2 100%   BMI 17.42 kg/m  Pain Scale: 0-10   Pain Score: 5    SpO2: SpO2: 100 % O2 Device:SpO2: 100 % O2 Flow Rate: .   IO: Intake/output summary:   Intake/Output Summary (Last 24 hours) at 03/17/2019 1628 Last data filed at 03/17/2019 1424 Gross per 24 hour  Intake 2327.66 ml  Output 200 ml  Net 2127.66 ml    LBM: Last BM Date: 03/17/19 Baseline Weight: Weight: 75.8 kg Most recent weight: Weight: 63.2 kg     Palliative Assessment/Data: PPS 40%   Flowsheet Rows     Most Recent Value  Intake Tab  Referral Department  Critical care  Unit at Time of Referral  ICU  Palliative Care Primary Diagnosis  Sepsis/Infectious Disease  Palliative Care Type  New Palliative care  Reason for referral  Clarify Goals of Care  Date first seen by Palliative Care  03/17/19  Clinical Assessment  Palliative  Performance Scale Score  40%  Psychosocial & Spiritual Assessment  Palliative Care Outcomes  Patient/Family meeting held?  Yes  Who was at the meeting?  patient  Palliative Care Outcomes  Clarified goals of care, Provided psychosocial or spiritual support, ACP counseling assistance      Time In/Out: 0930-0950, 1410-1500 Time Total: 70 Greater than 50%  of this time was spent counseling and coordinating care related to the above assessment and plan.  Signed by:  Ihor Dow, FNP-C Palliative Medicine Team  Phone: [phone] Fax: [phone]   Please contact Palliative Medicine Team phone at [phone] for questions and concerns.  For individual provider: See Shea Evans

## 2019-03-17 NOTE — Progress Notes (Signed)
eLink Physician-Brief Progress Note Patient Name: Dash Thorell DOB: [birth year] MRN: [id number]   Date of Service  03/17/2019  HPI/Events of Note  Patient with diarrhea.  Positive for C Diff.  No reported prior occurrence.  eICU Interventions  Consult to pharmacy for C Diff management/protocol.  Already on Flagyl/Vanc/Cefepime.     Intervention Category Major Interventions: Other:  Vista Sawatzky 03/17/2019, 5:48 AM

## 2019-03-17 NOTE — Progress Notes (Signed)
Initial Nutrition Assessment  DOCUMENTATION CODES:   Underweight  INTERVENTION:  Once NGT placed and confirmed, initiate Vital 1.5 at 15 mL/hr and advance by 20 mL/hr every 8 hours to goal rate of 55 mL/hr. Also provide Pro-Stat 30 mL once daily per tube. Goal regimen provides 2080 kcal, 104 grams of protein, 1003 mL H2O daily.  Provide minimum free water flush of 30 mL Q4hrs to maintain tube patency.  Continue liquid MVI daily per tube, thiamine 100 mg daily per tube, folic acid 1 mg daily per tube.  Provide Juven BID per tube to promote wound healing.  Monitor magnesium, potassium, and phosphorus daily for at least 3 days, MD to replete as needed, as pt is at risk for refeeding syndrome.  NUTRITION DIAGNOSIS:   Increased nutrient needs related to catabolic illness(right elbow cellulitis, colitis, wound healing, critical illness) as evidenced by estimated needs.  GOAL:   Patient will meet greater than or equal to 90% of their needs, Provide needs based on ASPEN/SCCM guidelines  MONITOR:   Diet advancement, Labs, Weight trends, TF tolerance, Skin, I & O's  REASON FOR ASSESSMENT:   Consult Assessment of nutrition requirement/status  ASSESSMENT:   63 year old male admitted with sepsis, MRSA bacteremia, PNA, AKI, right elbow cellulitis, C difficile colitis, adult FTT, multiple skin lesions.   Unable to enter room as patient is on enteric precautions in effort to preserve PPE. Patient currently sleeping. No weight history in chart to trend. He is currently 63.2 kg (139.33 lbs). Per discussion in rounds patient's house was in poor conditions. He was found with a backpack filled with dog food (for his dog) and a sandwich.  Medications reviewed and include: folic acid 1 mg daily per tube, liquid MVI daily per tube, thiamine 100 mg daily per tube, vancomycin PO, ceftriaxone, doxycycline, famotidine, Flagyl, sodium bicarbonate 150 mEq at 100 mL/hr, vancomycin IV.  Labs reviewed:  Sodium 134, Potassium 2.3, CO2 16, BUN 60, Creatinine 3.56, Phosphorus 4.3, Magnesium 2.1.  Enteral access: none at this time; plan is for placement of NGT today  MAP: 57-69 mmHg  RD suspects patient is malnourished, but unable to confirm without completing NFPE.  Discussed with RN and on rounds. Patient not alert enough to swallow. Plan is for placement of NGT and initiation of tube feeds today.  NUTRITION - FOCUSED PHYSICAL EXAM:  Unable to complete at this time as patient is on enteric precautions.  Diet Order:   Diet Order            Diet NPO time specified  Diet effective now             EDUCATION NEEDS:   Not appropriate for education at this time  Skin:  Skin Assessment: Skin Integrity Issues:(Scattered blisters; stg II left buttocks (2cm x 2cm); stg IV right elbow; stg II right arm; stg II left tibial; stg II right buttocks; DTI left heel; DTI right heel; stg II left elbow)   Last BM:  03/17/2019 - large type 7  Height:   Ht Readings from Last 1 Encounters:  April 08, 2019 6\' 3"  (1.905 m)   Weight:   Wt Readings from Last 1 Encounters:  04/08/19 63.2 kg   Ideal Body Weight:  89.1 kg  BMI:  Body mass index is 17.42 kg/m.  Estimated Nutritional Needs:   Kcal:  8875-7972 (MSJ x 1.3-1.5)  Protein:  95-115 grams (1.5-1.8 grams/kg)  Fluid:  2-2.3 L/day (1 mL/kcal)  Helane Rima, MS, RD, LDN Office: [phone]  Pager: [phone] After Hours/Weekend Pager: [phone]

## 2019-03-17 NOTE — Progress Notes (Signed)
CRITICAL CARE NOTE  CC  Follow up sepsis  SUBJECTIVE Prognosis is guarded Low BP giving IVF's Pleasantly confused  C diff + On abx regimen WBC is elevated 21  Creatinine elevated but coming down Giving more IV fluids today Tox screen positive for cannabinoids   BP (!) 86/53   Pulse 91   Temp 97.7 F (36.5 C) (Axillary)   Resp (!) 22   Ht 6\' 3"  (1.905 m)   Wt 63.2 kg   SpO2 98%   BMI 17.42 kg/m    I/O last 3 completed shifts: In: 2327.7 [I.V.:1267; IV Piggyback:1060.7] Out: -  No intake/output data recorded.  SpO2: 98 %  CBC    Component Value Date/Time   WBC 21.0 (H) 03/17/2019 0636   RBC 2.79 (L) 03/17/2019 0636   HGB 8.5 (L) 03/17/2019 0636   HCT 25.0 (L) 03/17/2019 0636   PLT 115 (L) 03/17/2019 0636   MCV 89.6 03/17/2019 0636   MCH 30.5 03/17/2019 0636   MCHC 34.0 03/17/2019 0636   RDW 14.3 03/17/2019 0636   LYMPHSABS 0.6 (L) 03/05/2019 1216   MONOABS 0.5 03/03/2019 1216   EOSABS 0.0 03/22/2019 1216   BASOSABS 0.1 03/12/2019 1216   BMP Latest Ref Rng & Units 03/17/2019 03/04/2019 03/07/2019  Glucose 70 - 99 mg/dL 92 845(X) 646(O)  BUN 8 - 23 mg/dL 03(O) 12(Y) 48(G)  Creatinine 0.61 - 1.24 mg/dL 5.00(B) 7.04(U) 8.89(V)  Sodium 135 - 145 mmol/L 134(L) 133(L) 131(L)  Potassium 3.5 - 5.1 mmol/L 2.3(LL) <2.0(LL) 2.1(LL)  Chloride 98 - 111 mmol/L 104 99 92(L)  CO2 22 - 32 mmol/L 16(L) 17(L) 11(L)  Calcium 8.9 - 10.3 mg/dL 7.2(L) 7.0(L) 7.6(L)     SIGNIFICANT EVENTS 5/17 admitted for sepsis 5/18 C diff POS  REVIEW OF SYSTEMS  PATIENT IS UNABLE TO PROVIDE COMPLETE REVIEW OF SYSTEMS DUE TO SEVERE CRITICAL ILLNESS   PHYSICAL EXAMINATION:  GENERAL:critically ill appearing, thin frail malnourished HEAD: Normocephalic, atraumatic.  EYES: Pupils equal, round, reactive to light.  No scleral icterus.  MOUTH: Moist mucosal membrane. NECK: Supple. No thyromegaly. No nodules. No JVD.  PULMONARY: +rhonchi,  CARDIOVASCULAR: S1 and S2. Regular rate and  rhythm. No murmurs, rubs, or gallops.  GASTROINTESTINAL: Soft, nontender, -distended. No masses. Positive bowel sounds. No hepatosplenomegaly.  MUSCULOSKELETAL: No swelling, clubbing, or edema.  NEUROLOGIC: Pleasantly confused follows commands minimally SKIN abnormal skin exam with multiple hemorrhagic bullous lesions and ulcers  MEDICATIONS: I have reviewed all medications and confirmed regimen as documented  MICRO RESULTS May 18 COVID test negative May 18 MRSA positive May 18 C. difficile positive May 18 blood cultures positive for MRSA   COVID test negative COVID negative   CULTURE RESULTS   Recent Results (from the past 240 hour(s))  Blood Culture (routine x 2)     Status: None (Preliminary result)   Collection Time: 03/12/2019 12:16 PM  Result Value Ref Range Status   Specimen Description BLOOD BLOOD RIGHT FOREARM  Final   Special Requests   Final    BOTTLES DRAWN AEROBIC AND ANAEROBIC Blood Culture results may not be optimal due to an excessive volume of blood received in culture bottles   Culture  Setup Time   Final    GRAM POSITIVE COCCI IN BOTH AEROBIC AND ANAEROBIC BOTTLES CRITICAL RESULT CALLED TO, READ BACK BY AND VERIFIED WITH: JASON ROBBINS ON 03/17/2019 AT 0012 QSD Performed at [facility], [address], [place], [place] [address]    Culture GRAM POSITIVE COCCI  Final   Report Status PENDING  Incomplete  Blood Culture (routine x 2)     Status: None (Preliminary result)   Collection Time: 03/26/2019 12:16 PM  Result Value Ref Range Status   Specimen Description BLOOD LAC  Final   Special Requests   Final    BOTTLES DRAWN AEROBIC AND ANAEROBIC Blood Culture results may not be optimal due to an excessive volume of blood received in culture bottles   Culture  Setup Time   Final    Organism ID to follow GRAM POSITIVE COCCI IN BOTH AEROBIC AND ANAEROBIC BOTTLES CRITICAL RESULT CALLED TO, READ BACK BY AND VERIFIED WITH: JASON ROBBINS ON 03/17/2019 AT 0012  QSD Performed at [facility], [address] [place], [place] [address]    Culture [facility]I  Final   Report Status PENDING  Incomplete  SARS Coronavirus 2 (CEPHEID- Performed [facility]), Hosp Order     Status: None   Collection Time: 03/11/2019 12:16 PM  Result Value Ref Range Status   SARS Coronavirus 2 NEGATIVE NEGATIVE Final    Comment: (NOTE) If result is NEGATIVE SARS-CoV-2 target nucleic acids are NOT DETECTED. The SARS-CoV-2 RNA is generally detectable in upper and lower  respiratory specimens during the acute phase of infection. The lowest  concentration of SARS-CoV-2 viral copies this assay can detect is 250  copies / mL. A negative result does not preclude SARS-CoV-2 infection  and should not be used as the sole basis for treatment or other  patient management decisions.  A negative result may occur with  improper specimen collection / handling, submission of specimen other  than nasopharyngeal swab, presence of viral mutation(s) within the  areas targeted by this assay, and inadequate number of viral copies  (<250 copies / mL). A negative result must be combined with clinical  observations, patient history, and epidemiological information. If result is POSITIVE SARS-CoV-2 target nucleic acids are DETECTED. The SARS-CoV-2 RNA is generally detectable in upper and lower  respiratory specimens dur ing the acute phase of infection.  Positive  results are indicative of active infection with SARS-CoV-2.  Clinical  correlation with patient history and other diagnostic information is  necessary to determine patient infection status.  Positive results do  not rule out bacterial infection or co-infection with other viruses. If result is PRESUMPTIVE POSTIVE SARS-CoV-2 nucleic acids MAY BE PRESENT.   A presumptive positive result was obtained on the submitted specimen  and confirmed on repeat testing.  While 2019 novel coronavirus  (SARS-CoV-2)  nucleic acids may be present in the submitted sample  additional confirmatory testing may be necessary for epidemiological  and / or clinical management purposes  to differentiate between  SARS-CoV-2 and other Sarbecovirus currently known to infect humans.  If clinically indicated additional testing with an alternate test  methodology [phone]([id number]) is advised. The SARS-CoV-2 RNA is generally  detectable in upper and lower respiratory sp ecimens during the acute  phase of infection. The expected result is Negative. Fact Sheet for Patients:  BoilerBrush.com.cyhttps://www.fda.gov/media/136312/download Fact Sheet for Healthcare Providers: https://pope.com/https://www.fda.gov/media/136313/download This test is not yet approved or cleared by the [place] FDA and has been authorized for detection and/or diagnosis of SARS-CoV-2 by FDA under an Emergency Use Authorization (EUA).  This EUA will remain in effect (meaning this test can be used) for the duration of the COVID-19 declaration under Section 564(b)(1) of the Act, 21 U.S.C. section 360bbb-3(b)(1), unless the authorization is terminated or revoked sooner. Performed at  [facility], [address] [place], [place] [address]   Blood Culture ID Panel (Reflexed)     Status: Abnormal   Collection Time: 03-30-19 12:16 PM  Result Value Ref Range Status   Enterococcus species NOT DETECTED NOT DETECTED Final   Listeria monocytogenes NOT DETECTED NOT DETECTED Final   Staphylococcus species DETECTED (A) NOT DETECTED Final    Comment: CRITICAL RESULT CALLED TO, READ BACK BY AND VERIFIED WITH: JASON ROBBINS ON 03/17/2019 AT 0012 QSD    Staphylococcus aureus (BCID) DETECTED (A) NOT DETECTED Final    Comment: Methicillin (oxacillin)-resistant Staphylococcus aureus (MRSA). MRSA is predictably resistant to beta-lactam antibiotics (except ceftaroline). Preferred therapy is vancomycin unless clinically contraindicated. Patient requires contact precautions if   hospitalized. CRITICAL RESULT CALLED TO, READ BACK BY AND VERIFIED WITH: JASON ROBBINS ON 03/17/2019 AT 0012 QSD    Methicillin resistance DETECTED (A) NOT DETECTED Final    Comment: CRITICAL RESULT CALLED TO, READ BACK BY AND VERIFIED WITH: JASON ROBBINS ON 03/17/2019 AT 0012 QSD    Streptococcus species NOT DETECTED NOT DETECTED Final   Streptococcus agalactiae NOT DETECTED NOT DETECTED Final   Streptococcus pneumoniae NOT DETECTED NOT DETECTED Final   Streptococcus pyogenes NOT DETECTED NOT DETECTED Final   Acinetobacter baumannii NOT DETECTED NOT DETECTED Final   Enterobacteriaceae species NOT DETECTED NOT DETECTED Final   Enterobacter cloacae complex NOT DETECTED NOT DETECTED Final   Escherichia coli NOT DETECTED NOT DETECTED Final   Klebsiella oxytoca NOT DETECTED NOT DETECTED Final   Klebsiella pneumoniae NOT DETECTED NOT DETECTED Final   Proteus species NOT DETECTED NOT DETECTED Final   Serratia marcescens NOT DETECTED NOT DETECTED Final   Haemophilus influenzae NOT DETECTED NOT DETECTED Final   Neisseria meningitidis NOT DETECTED NOT DETECTED Final   Pseudomonas aeruginosa NOT DETECTED NOT DETECTED Final   Candida albicans NOT DETECTED NOT DETECTED Final   Candida glabrata NOT DETECTED NOT DETECTED Final   Candida krusei NOT DETECTED NOT DETECTED Final   Candida parapsilosis NOT DETECTED NOT DETECTED Final   Candida tropicalis NOT DETECTED NOT DETECTED Final    Comment: Performed at [facility], [address] [place], [place] [address]  MRSA PCR Screening     Status: Abnormal   Collection Time: 2019/03/30  6:16 PM  Result Value Ref Range Status   MRSA by PCR POSITIVE (A) NEGATIVE Final    Comment:        The GeneXpert MRSA Assay (FDA approved for NASAL specimens only), is one component of a comprehensive MRSA colonization surveillance program. It is not intended to diagnose MRSA infection nor to guide or monitor treatment for MRSA infections. RESULT  CALLED TO, READ BACK BY AND VERIFIED WITH: PREDOME,F AT 2001 ON Mar 30, 2019 BY MOSLEY,J Performed at [facility], [address] [place], [place] [address]   C difficile quick scan w PCR reflex     Status: Abnormal   Collection Time: 03/17/19  4:45 AM  Result Value Ref Range Status   C Diff antigen POSITIVE (A) NEGATIVE Final   C Diff toxin POSITIVE (A) NEGATIVE Final    Comment: RESULT CALLED TO, READ BACK BY AND VERIFIED WITH: FELICIA PREUDHOMME @0545  03/17/19 AKT    C Diff interpretation Toxin producing C. difficile detected.  Final    Comment: Performed at [facility], [address] [place], [place] [address]          IMAGING    Dg Elbow 2 Views Left  Result Date: 03/30/2019  CLINICAL DATA:  Pain with movement EXAM: LEFT ELBOW - 2 VIEW COMPARISON:  None. FINDINGS: The joint effusion is identified with displacement of the anterior fat pad. No fractures is identified. No bony erosion or bony lesion. IMPRESSION: Joint effusion of uncertain etiology.  No bony abnormality noted. Electronically Signed   By: Gerome Samavid  Williams III M.D   On: 02/26/2019 13:00   Dg Elbow 2 Views Right  Result Date: 02-Oct-2019 CLINICAL DATA:  Possible sepsis. Shortness of breath. Numerous festering wounds. Open wound posterior right elbow and at medial surface of right ankle. Pain in left elbow with movement. EXAM: RIGHT ELBOW - 2 VIEW COMPARISON:  None. FINDINGS: There is a wound over the olecranon. There is mild sclerosis in the underlying olecranon without identified bony erosion. There is suggestion of a possible small joint effusion. No fractures. IMPRESSION: 1. There is a deep wound posterior to the olecranon. Sclerosis in the underlying olecranon is identified without bony erosion. The sclerosis could represent sequela of previous osteomyelitis. An MRI could further assess if clinically warranted. 2. Suspected small joint effusion. 3. No fractures. Electronically Signed   By: Gerome Samavid   Williams III M.D   On: 02/26/2019 12:56   Dg Ankle 2 Views Right  Result Date: 02-Oct-2019 CLINICAL DATA:  Evaluate for sepsis. EXAM: RIGHT ANKLE - 2 VIEW COMPARISON:  None. FINDINGS: No fracture or dislocation. The ankle mortise is intact. Reported skin defect over the medial surface of the right ankle is not visualized on this study. No bony erosion or evidence of osteomyelitis. IMPRESSION: No evidence of osteomyelitis. Electronically Signed   By: Gerome Samavid  Williams III M.D   On: 02/26/2019 12:59   Ct Head Wo Contrast  Result Date: 02-Oct-2019 CLINICAL DATA:  63 year old with unexplained acute mental status changes as the patient is unable to speak clearly. Patient also presents with respiratory distress and has widespread abrasions. EXAM: CT HEAD WITHOUT CONTRAST TECHNIQUE: Contiguous axial images were obtained from the base of the skull through the vertex without intravenous contrast. COMPARISON:  None. FINDINGS: Brain: Ventricular system normal in size and appearance for age. Very small old lacunar stroke in the LEFT thalamus. Mild changes of small vessel disease of the white matter diffusely. No mass lesion. No midline shift. No acute hemorrhage or hematoma. No extra-axial fluid collections. No evidence of acute infarction. Vascular: Mild BILATERAL vertebral artery atherosclerosis. No hyperdense vessel. Skull: No skull fracture or other focal osseous abnormality involving the skull. Sinuses/Orbits: Mucous retention cyst or polyp in the RIGHT maxillary sinus. Remaining visualized paranasal sinuses, BILATERAL mastoid air cells and BILATERAL middle ear cavities well-aerated. Visualized orbits and globes unremarkable. Other: None. IMPRESSION: 1. No acute intracranial abnormality. 2. Mild chronic microvascular ischemic changes of the white matter and very small old lacunar stroke in the left thalamus. 3. Mild chronic right maxillary sinus disease. Electronically Signed   By: Hulan Saashomas  Lawrence M.D.   On:  02/26/2019 13:35   Dg Chest Port 1 View  Result Date: 02-Oct-2019 CLINICAL DATA:  Sepsis. EXAM: PORTABLE CHEST 1 VIEW COMPARISON:  None FINDINGS: The thoracic aorta is prominent tortuous. The heart, hila, and mediastinum are unremarkable. The left lung is clear. Mild opacity in the right mid lung. Mild atelectasis in the right base. No other acute abnormalities. IMPRESSION: 1. Possible mild opacity in the right mid lung could represent subtle pneumonia. A better volume PA and lateral chest x-ray may better evaluate. 2. Prominent tortuous thoracic aorta. Electronically Signed   By: Gerome Samavid  Williams III M.D  On: 03/25/2019 12:58       ASSESSMENT AND PLAN SYNOPSIS   63 yo WM with acute metabolic encephalopathy from severe metabolic acidosis and severe sepsis with RT mid opacity likely pneumonia(?mass) with left elbow effusion and multiple skin lesions complicated by acute renal failure associated with malnourishment and FTT.  Patient with C. difficile colitis and MRSA bacteremia   Right midlung opacity consistent with pneumonia Questionable mass Will need CT chest at some point to further evaluate Continue IV antibiotics as prescribed COVID negative test  Severe sepsis Use pressors as needed to keep map above 60 Follow-up ABG and lactic acid Follow-up ID consultation Aggressive IV fluids Check echo Check HIV Check hep panel  Right elbow cellulitis MRI pending to assess for osteomyelitis  ACUTE KIDNEY INJURY/Renal Failure -follow chem 7 -follow UO -continue Foley Catheter-assess need -Avoid nephrotoxic agents -Recheck creatinine  -Renal ultrasound   NEUROLOGY Encephalopathy from sepsis Avoid sedatives    CARDIAC ICU monitoring  ID -continue IV abx as prescibed -follow up cultures  GI GI PROPHYLAXIS as indicated  NUTRITIONAL STATUS DIET--> advance diet as tolerated Constipation protocol as indicated  ENDO - will use ICU hypoglycemic\Hyperglycemia protocol  if indicated   ELECTROLYTES -follow labs as needed -replace as needed -pharmacy consultation and following   DVT/GI PRX ordered TRANSFUSIONS AS NEEDED MONITOR FSBS ASSESS the need for LABS as needed   Critical Care Time devoted to patient care services described in this note is 32 minutes.   Overall, patient is critically ill, prognosis is guarded.     Lucie LeatherKurian David Kimberlin Scheel, M.D.  [facility]  Medical Director [facility] Medical Director [facility] Cardio-Pulmonary Department

## 2019-03-18 ENCOUNTER — Inpatient Hospital Stay: Payer: Medicaid Other

## 2019-03-18 DIAGNOSIS — J9600 Acute respiratory failure, unspecified whether with hypoxia or hypercapnia: Secondary | ICD-10-CM

## 2019-03-18 DIAGNOSIS — Z978 Presence of other specified devices: Secondary | ICD-10-CM

## 2019-03-18 DIAGNOSIS — J9601 Acute respiratory failure with hypoxia: Secondary | ICD-10-CM

## 2019-03-18 DIAGNOSIS — Z9911 Dependence on respirator [ventilator] status: Secondary | ICD-10-CM

## 2019-03-18 DIAGNOSIS — Z8719 Personal history of other diseases of the digestive system: Secondary | ICD-10-CM

## 2019-03-18 DIAGNOSIS — I7103 Dissection of thoracoabdominal aorta: Secondary | ICD-10-CM

## 2019-03-18 DIAGNOSIS — L989 Disorder of the skin and subcutaneous tissue, unspecified: Secondary | ICD-10-CM

## 2019-03-18 LAB — GLUCOSE, CAPILLARY
Glucose-Capillary: 104 mg/dL — ABNORMAL HIGH (ref 70–99)
Glucose-Capillary: 105 mg/dL — ABNORMAL HIGH (ref 70–99)
Glucose-Capillary: 108 mg/dL — ABNORMAL HIGH (ref 70–99)
Glucose-Capillary: 75 mg/dL (ref 70–99)
Glucose-Capillary: 89 mg/dL (ref 70–99)
Glucose-Capillary: 93 mg/dL (ref 70–99)
Glucose-Capillary: 98 mg/dL (ref 70–99)

## 2019-03-18 LAB — BASIC METABOLIC PANEL
Anion gap: 14 (ref 5–15)
BUN: 55 mg/dL — ABNORMAL HIGH (ref 8–23)
CO2: 16 mmol/L — ABNORMAL LOW (ref 22–32)
Calcium: 7.4 mg/dL — ABNORMAL LOW (ref 8.9–10.3)
Chloride: 106 mmol/L (ref 98–111)
Creatinine, Ser: 2.77 mg/dL — ABNORMAL HIGH (ref 0.61–1.24)
GFR calc Af Amer: 27 mL/min — ABNORMAL LOW (ref >60)
GFR calc non Af Amer: 23 mL/min — ABNORMAL LOW (ref >60)
Glucose, Bld: 80 mg/dL (ref 70–99)
Potassium: 3.7 mmol/L (ref 3.5–5.1)
Sodium: 136 mmol/L (ref 135–145)

## 2019-03-18 LAB — URINE CULTURE: Culture: >=100000 — AB

## 2019-03-18 LAB — BLOOD GAS, ARTERIAL
Acid-base deficit: 10.6 mmol/L — ABNORMAL HIGH (ref 0.0–2.0)
Bicarbonate: 17.9 mmol/L — ABNORMAL LOW (ref 20.0–28.0)
FIO2: 0.5
MECHVT: 450 mL
O2 Saturation: 95.4 %
PEEP: 5 cmH2O
Patient temperature: 37
RATE: 15 resp/min
pCO2 arterial: 49 mmHg — ABNORMAL HIGH (ref 32.0–48.0)
pH, Arterial: 7.17 — CL (ref 7.350–7.450)
pO2, Arterial: 97 mmHg (ref 83.0–108.0)

## 2019-03-18 LAB — CBC WITH DIFFERENTIAL/PLATELET
Abs Immature Granulocytes: 0.16 10*3/uL — ABNORMAL HIGH (ref 0.00–0.07)
Basophils Absolute: 0.1 10*3/uL (ref 0.0–0.1)
Basophils Relative: 0 %
Eosinophils Absolute: 0 10*3/uL (ref 0.0–0.5)
Eosinophils Relative: 0 %
HCT: 23.7 % — ABNORMAL LOW (ref 39.0–52.0)
Hemoglobin: 8.3 g/dL — ABNORMAL LOW (ref 13.0–17.0)
Immature Granulocytes: 1 %
Lymphocytes Relative: 7 %
Lymphs Abs: 1.6 10*3/uL (ref 0.7–4.0)
MCH: 31.2 pg (ref 26.0–34.0)
MCHC: 35 g/dL (ref 30.0–36.0)
MCV: 89.1 fL (ref 80.0–100.0)
Monocytes Absolute: 0.3 10*3/uL (ref 0.1–1.0)
Monocytes Relative: 2 %
Neutro Abs: 19.5 10*3/uL — ABNORMAL HIGH (ref 1.7–7.7)
Neutrophils Relative %: 90 %
Platelets: 90 10*3/uL — ABNORMAL LOW (ref 150–400)
RBC: 2.66 MIL/uL — ABNORMAL LOW (ref 4.22–5.81)
RDW: 14.5 % (ref 11.5–15.5)
WBC: 21.6 10*3/uL — ABNORMAL HIGH (ref 4.0–10.5)
nRBC: 0 % (ref 0.0–0.2)

## 2019-03-18 LAB — MAGNESIUM: Magnesium: 1.9 mg/dL (ref 1.7–2.4)

## 2019-03-18 LAB — HIV ANTIBODY (ROUTINE TESTING W REFLEX): HIV Screen 4th Generation wRfx: NONREACTIVE

## 2019-03-18 LAB — C DIFFICILE QUICK SCREEN W PCR REFLEX
C Diff antigen: POSITIVE — AB
C Diff interpretation: DETECTED
C Diff toxin: POSITIVE — AB

## 2019-03-18 LAB — PHOSPHORUS: Phosphorus: 2.7 mg/dL (ref 2.5–4.6)

## 2019-03-18 MED ORDER — POTASSIUM CHLORIDE 20 MEQ PO PACK: 40.0000 meq | PACK | Freq: Once | ORAL | Status: DC

## 2019-03-18 MED ORDER — LORAZEPAM 2 MG/ML IJ SOLN
INTRAMUSCULAR | Status: AC
  Filled 2019-03-18: qty 1

## 2019-03-18 MED ORDER — LACTATED RINGERS IV BOLUS
500.0000 mL | Freq: Once | INTRAVENOUS | Status: AC
  Administered 2019-03-18: 500 mL via INTRAVENOUS

## 2019-03-18 MED ORDER — MIDAZOLAM HCL 2 MG/2ML IJ SOLN
INTRAMUSCULAR | Status: AC
  Filled 2019-03-18: qty 2

## 2019-03-18 MED ORDER — ROCURONIUM BROMIDE 50 MG/5ML IV SOLN
INTRAVENOUS | Status: AC
  Administered 2019-03-18: 50 mg via INTRAVENOUS
  Filled 2019-03-18: qty 1

## 2019-03-18 MED ORDER — CHLORHEXIDINE GLUCONATE 0.12% ORAL RINSE (MEDLINE KIT)
15.0000 mL | Freq: Two times a day (BID) | OROMUCOSAL | Status: DC
  Administered 2019-03-18 – 2019-03-22 (×10): 15 mL via OROMUCOSAL

## 2019-03-18 MED ORDER — ETOMIDATE 2 MG/ML IV SOLN
20.0000 mg | Freq: Once | INTRAVENOUS | Status: AC
  Administered 2019-03-18: 07:00:00 20 mg via INTRAVENOUS

## 2019-03-18 MED ORDER — ORAL CARE MOUTH RINSE
15.0000 mL | OROMUCOSAL | Status: DC
  Administered 2019-03-18 – 2019-03-22 (×47): 15 mL via OROMUCOSAL

## 2019-03-18 MED ORDER — LORAZEPAM 1 MG PO TABS: 1.0000 mg | ORAL_TABLET | Freq: Four times a day (QID) | ORAL | Status: DC | PRN

## 2019-03-18 MED ORDER — FENTANYL CITRATE (PF) 100 MCG/2ML IJ SOLN
INTRAMUSCULAR | Status: AC
  Administered 2019-03-18: 07:00:00 100 ug via INTRAVENOUS
  Filled 2019-03-18: qty 2

## 2019-03-18 MED ORDER — ETOMIDATE 2 MG/ML IV SOLN
INTRAVENOUS | Status: AC
  Administered 2019-03-18: 07:00:00 20 mg via INTRAVENOUS
  Filled 2019-03-18: qty 10

## 2019-03-18 MED ORDER — LORAZEPAM 2 MG/ML IJ SOLN
1.0000 mg | INTRAMUSCULAR | Status: DC | PRN
  Administered 2019-03-18 – 2019-03-21 (×3): 2 mg via INTRAVENOUS
  Filled 2019-03-18 (×3): qty 1

## 2019-03-18 MED ORDER — ORAL CARE MOUTH RINSE: 15.0000 mL | Freq: Two times a day (BID) | OROMUCOSAL | Status: DC

## 2019-03-18 MED ORDER — LORAZEPAM 2 MG/ML IJ SOLN
1.0000 mg | INTRAMUSCULAR | Status: DC | PRN
  Administered 2019-03-18: 06:00:00 2 mg via INTRAVENOUS
  Filled 2019-03-18: qty 1

## 2019-03-18 MED ORDER — HYDRALAZINE HCL 20 MG/ML IJ SOLN
10.0000 mg | INTRAMUSCULAR | Status: DC | PRN
  Administered 2019-03-18 – 2019-03-19 (×2): 20 mg via INTRAVENOUS
  Filled 2019-03-18 (×2): qty 1

## 2019-03-18 MED ORDER — ROCURONIUM BROMIDE 50 MG/5ML IV SOLN
50.0000 mg | Freq: Once | INTRAVENOUS | Status: AC
  Administered 2019-03-18: 07:00:00 50 mg via INTRAVENOUS

## 2019-03-18 MED ORDER — METOPROLOL SUCCINATE ER 50 MG PO TB24: 75.0000 mg | ORAL_TABLET | Freq: Every day | ORAL | Status: DC

## 2019-03-18 MED ORDER — LORAZEPAM 1 MG PO TABS: 1.0000 mg | ORAL_TABLET | ORAL | Status: DC | PRN

## 2019-03-18 MED ORDER — VANCOMYCIN HCL 500 MG IV SOLR
500.0000 mg | Freq: Four times a day (QID) | Status: DC
  Administered 2019-03-18 – 2019-03-19 (×4): 500 mg via RECTAL
  Filled 2019-03-18 (×7): qty 500

## 2019-03-18 MED ORDER — LORAZEPAM 2 MG/ML IJ SOLN
1.0000 mg | Freq: Once | INTRAMUSCULAR | Status: AC
  Administered 2019-03-18: 06:00:00 1 mg via INTRAVENOUS

## 2019-03-18 MED ORDER — METRONIDAZOLE IN NACL 5-0.79 MG/ML-% IV SOLN
500.0000 mg | Freq: Three times a day (TID) | INTRAVENOUS | Status: DC
  Administered 2019-03-18: 08:00:00 500 mg via INTRAVENOUS
  Filled 2019-03-18 (×3): qty 100

## 2019-03-18 MED ORDER — LORAZEPAM 2 MG/ML IJ SOLN: 1.0000 mg | Freq: Four times a day (QID) | INTRAMUSCULAR | Status: DC | PRN

## 2019-03-18 MED ORDER — FENTANYL CITRATE (PF) 100 MCG/2ML IJ SOLN
100.0000 ug | Freq: Once | INTRAMUSCULAR | Status: AC
  Administered 2019-03-18: 100 ug via INTRAVENOUS

## 2019-03-18 MED ORDER — METRONIDAZOLE IN NACL 5-0.79 MG/ML-% IV SOLN
500.0000 mg | Freq: Three times a day (TID) | INTRAVENOUS | Status: DC
  Administered 2019-03-18 – 2019-03-19 (×4): 500 mg via INTRAVENOUS
  Filled 2019-03-18 (×6): qty 100

## 2019-03-18 MED ORDER — FENTANYL CITRATE (PF) 100 MCG/2ML IJ SOLN: 25.0000 ug | INTRAMUSCULAR | Status: DC | PRN

## 2019-03-18 MED ORDER — MAGNESIUM SULFATE IN D5W 1-5 GM/100ML-% IV SOLN
1.0000 g | Freq: Once | INTRAVENOUS | Status: AC
  Administered 2019-03-18: 1 g via INTRAVENOUS
  Filled 2019-03-18: qty 100

## 2019-03-18 MED ORDER — METOPROLOL SUCCINATE ER 50 MG PO TB24
25.0000 mg | ORAL_TABLET | Freq: Every day | ORAL | Status: DC
  Filled 2019-03-18: qty 1

## 2019-03-18 MED ORDER — SODIUM CHLORIDE 0.9 % IV SOLN: INTRAVENOUS | Status: DC

## 2019-03-18 MED ORDER — FENTANYL 2500MCG IN NS 250ML (10MCG/ML) PREMIX INFUSION
INTRAVENOUS | Status: AC
  Administered 2019-03-18: 07:00:00 100 ug/h via INTRAVENOUS
  Filled 2019-03-18: qty 250

## 2019-03-18 MED ORDER — THIAMINE HCL 100 MG/ML IJ SOLN
Freq: Once | INTRAVENOUS | Status: AC
  Administered 2019-03-18: 16:00:00 via INTRAVENOUS
  Filled 2019-03-18: qty 1000

## 2019-03-18 MED ORDER — FENTANYL 2500MCG IN NS 250ML (10MCG/ML) PREMIX INFUSION
0.0000 ug/h | INTRAVENOUS | Status: DC
  Administered 2019-03-18: 100 ug/h via INTRAVENOUS
  Administered 2019-03-18: 125 ug/h via INTRAVENOUS
  Administered 2019-03-19 – 2019-03-20 (×2): 200 ug/h via INTRAVENOUS
  Administered 2019-03-20: 100 ug/h via INTRAVENOUS
  Administered 2019-03-21: 150 ug/h via INTRAVENOUS
  Administered 2019-03-22: 350 ug/h via INTRAVENOUS
  Administered 2019-03-22: 400 ug/h via INTRAVENOUS
  Administered 2019-03-22: 325 ug/h via INTRAVENOUS
  Administered 2019-03-22: 400 ug/h via INTRAVENOUS
  Filled 2019-03-18 (×9): qty 250

## 2019-03-18 MED ORDER — LACTATED RINGERS IV BOLUS
1000.0000 mL | INTRAVENOUS | Status: AC
  Administered 2019-03-18 (×4): 1000 mL via INTRAVENOUS

## 2019-03-18 MED ORDER — NITROGLYCERIN 2 % TD OINT
1.0000 [in_us] | TOPICAL_OINTMENT | Freq: Three times a day (TID) | TRANSDERMAL | Status: DC
  Administered 2019-03-18: 1 [in_us] via TOPICAL
  Filled 2019-03-18: qty 1

## 2019-03-18 NOTE — Progress Notes (Signed)
Pharmacy Electrolyte Monitoring Consult:  Pharmacy consulted to assist in monitoring and replacing electrolytes in this 63 y.o. male admitted on 2019-04-09 with Altered Mental Status   Labs:  Sodium (mmol/L)  Date Value  03/18/2019 136   Potassium (mmol/L)  Date Value  03/18/2019 3.7   Magnesium (mg/dL)  Date Value  37/07/6268 1.9   Phosphorus (mg/dL)  Date Value  48/54/6270 2.7   Calcium (mg/dL)  Date Value  35/00/9381 7.4 (L)   Albumin (g/dL)  Date Value  82/99/3716 1.7 (L)   Corrected calcium: 9  Assessment/Plan: Patient ordered magnesium 1 g IV x 1. Patient is not making urine. Will defer potassium replacement on 5/19.   Will check all electrolytes with am labs.   Will replace for goal potassium ~ 4 and goal magnesium ~ 2.   Pharmacy will continue to monitor and adjust per consult.   Alyxis Grippi L 03/18/2019 6:19 PM

## 2019-03-18 NOTE — Progress Notes (Signed)
Nutrition Follow-up  RD working remotely.  DOCUMENTATION CODES:   Underweight  INTERVENTION:  Once patient appropriate for enteral nutrition, recommend initiating Vital 1.5 at 15 mL/hr and advancing by 15 mL/hr every 8 hours to goal rate of 45 mL/hr (1080 mL goal daily volume). Also provide Pro-Stat 30 mL BID per tube. Goal regimen provides 1820 kcal, 103 grams of protein, 821 mL H2O daily.  Once tube feeds initiated recommend minimum free water flush of 30 mL Q4hrs to maintain tube patency.  Also provide Juven BID per tube to promote wound healing.  Monitor magnesium, potassium, and phosphorus daily for at least 3 days, MD to replete as needed, as pt is at risk for refeeding syndrome.  If patient will not be appropriate for enteral nutrition within the next few days, will need to consider initiation of TPN.  NUTRITION DIAGNOSIS:   Increased nutrient needs related to catabolic illness(right elbow cellulitis, colitis, wound healing, critical illness) as evidenced by estimated needs.  Ongoing.  GOAL:   Patient will meet greater than or equal to 90% of their needs, Provide needs based on ASPEN/SCCM guidelines  Not met at this time.  MONITOR:   Diet advancement, Labs, Weight trends, TF tolerance, Skin, I & O's  REASON FOR ASSESSMENT:   Consult Assessment of nutrition requirement/status  ASSESSMENT:   63 year old male admitted with sepsis, MRSA bacteremia, PNA, AKI, right elbow cellulitis, C difficile colitis, adult FTT, multiple skin lesions.  Patient now intubated and sedated as of this morning. On PRVC mode with FiO2 30% and PEE 5 cmH2O. Patient is having type 7 BMs. Per abdominal x-ray from this AM patient has mildly dilated small bowel loops concerning for ileus or possibly distal small bowel obstruction. Plan today is for decompression with NGT to suction.  Enteral Access: NGT placed 5/19; tip in proximal stomach and side hole just above GE junction (advancement is  recommended)  MAP: 66-290 mmHg  TF: have not yet been started; enteral access just obtained this AM  Patient is currently intubated on ventilator support Ve: 7.9 L/min Temp (24hrs), Avg:98 F (36.7 C), Min:97.5 F (36.4 C), Max:99 F (37.2 C)  Propofol: N/A  Medications reviewed and include: MVI daily, Juven 1 packet BID per tube, folic acid 1 mg daily, vancomycin, vitamin C 500 mg daily, fentanyl gtt, Flagyl, sodium bicarbonate 150 mEq in sterile water at 100 mL/hr, vancomycin.  Labs reviewed: CBG 75-89, Potassium now 3.7 after replacement, CO2 16, BUN 55, Creatinine 2.77, Phosphorus 2.7, Magnesium 1.9.  Diet Order:   Diet Order            Diet NPO time specified  Diet effective now             EDUCATION NEEDS:   Not appropriate for education at this time  Skin:  Skin Assessment: Skin Integrity Issues:(Scattered blisters; stg II left buttocks (2cm x 2cm); stg IV right elbow; stg II right arm; stg II left tibial; stg II right buttocks; DTI left heel; DTI right heel; stg II left elbow)  Last BM:  03/18/2019 - large type 7  Height:   Ht Readings from Last 1 Encounters:  03/20/2019 '6\' 3"'  (1.905 m)   Weight:   Wt Readings from Last 1 Encounters:  03/02/2019 63.2 kg   Ideal Body Weight:  89.1 kg  BMI:  Body mass index is 17.42 kg/m.  Estimated Nutritional Needs:   Kcal:  2876 (PSU 2003b w/ MSJ 1643, Ve 7.9, Tmax 37.2)  Protein:  95-115 grams (1.5-1.8 grams/kg)  Fluid:  2-2.3 L/day (1 mL/kcal)  Willey Blade, MS, RD, LDN Office: [phone] Pager: [phone] After Hours/Weekend Pager: [phone]

## 2019-03-18 NOTE — Consult Note (Signed)
Cardiology Consultation:   Patient ID: Leonard Peters MRN: [id number]; DOB: Feb 09, 1956  Admit date: 03/15/2019 Date of Consult: 03/18/2019  Primary Care Provider: System, Pcp Not In Primary Cardiologist: [facility] Primary Electrophysiologist:  None   Patient Profile:   Leonard Peters is a 63 y.o. 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 Mortimer Fries.  History of Present Illness:   Leonard Peters presented to the [facility] ED 2 days ago with altered mental status.  He reportedly endorsed several months of shortness of breath but otherwise had no complaints.  Multiple superficial wounds were noted on the upper and lower extremities as well as a deeper soft tissue wound overlying the left olecranon process.  Chest radiograph was also concerning for right middle lung pneumonia versus mass.  Patient was noted to have worsening metabolic acidosis and hypotension concerning for severe sepsis.  He required emergent intubation earlier today and is currently sedated.  Blood cultures here have grown MRSA.  Frequent watery bowel movements have also been noted, which are still C. difficile positive.  Leonard Peters was hospitalized at [facility] from 3/8 through sleep 01/17/2027 [facility] due to thoracicoabdominal aortic dissection (type B) as well as C. difficile colitis.  He was monitored closely by vascular surgery but did not undergo operative intervention, as dissection repair was deemed elective and could not be performed due to COVID-19 precautions.  He was discharged with planned outpatient follow-up.  Rash was also noted during that hospitalization, for which topical steroids were recommended by dermatology.  Leonard Peters was also evaluated by the dentistry service and underwent extraction of multiple  teeth on 01/16/2019.  Past medical history: Marfan syndrome Rheumatoid arthritis Hypertension Thoracoabdominal aortic aneurysm (type B) C. difficile colitis  History reviewed. No pertinent surgical history.   Home Medications:  Prior to Admission medications   Medication Sig Start Date [place] Date Taking? Authorizing Provider  FLUoxetine (PROZAC) 40 MG capsule Take 40 mg by mouth daily.   Yes [provider]  metoprolol succinate (TOPROL-XL) 50 MG 24 hr tablet Take 75 mg by mouth daily. 01/17/19  Yes [provider]  omeprazole (PRILOSEC) 20 MG capsule Take 20 mg by mouth daily. 11/13/18  Yes [provider]  oxyCODONE (OXY IR/ROXICODONE) 5 MG immediate release tablet Take 5 mg by mouth every 4 (four) hours as needed for pain.   Yes [provider]    Inpatient Medications: Scheduled Meds:  chlorhexidine gluconate (MEDLINE KIT)  15 mL Mouth Rinse BID   Chlorhexidine Gluconate Cloth  6 each Topical Q0600   FLUoxetine  40 mg Per Tube Daily   heparin injection (subcutaneous)  5,000 Units Subcutaneous Q8H   mouth rinse  15 mL Mouth Rinse q12n4p   mouth rinse  15 mL Mouth Rinse 10 times per day   mupirocin ointment  1 application Nasal BID   nutrition supplement (JUVEN)  1 packet Per Tube BID BM   vancomycin  500 mg Oral Q6H   Continuous Infusions:  sodium chloride     sodium chloride     fentaNYL infusion INTRAVENOUS 100 mcg/hr (03/18/19 0711)   banana bag IV 1000 mL     lactated ringers 1,000 mL (03/18/19 1501)   metronidazole      sodium bicarbonate (isotonic) infusion in sterile water 100 mL/hr at 03/18/19 0400   [  START ON 03/19/2019] vancomycin     PRN Meds: sodium chloride, acetaminophen **OR** acetaminophen, fentaNYL (SUBLIMAZE) injection, hydrALAZINE, LORazepam, ondansetron **OR** ondansetron (ZOFRAN) IV, polyethylene glycol, sodium chloride  Allergies:    Allergies  Allergen Reactions   Lipitor [Atorvastatin Calcium]       Elevated LFTs    Social History:   Social History   Tobacco Use   Smoking status: Current Every Day Smoker  Substance Use Topics   Alcohol use: Yes    Alcohol/week: 3.0 standard drinks    Types: 3 Cans of beer per week   Drug use: Yes    Types: Marijuana    Family History:   Unable to obtain, as the patient is intubated and sedated.  ROS:  Unable to perform, as the patient is intubated and sedated.  Physical Exam/Data:   Vitals:   03/18/19 0730 03/18/19 0800 03/18/19 1409 03/18/19 1528  BP: (!) 105/51 (!) 96/57    Pulse: (!) 136 (!) 136    Resp: 15 15    Temp:  99 F (37.2 C)    TempSrc:  Oral    SpO2: 100% 100% 100% 100%  Weight:      Height:        Intake/Output Summary (Last 24 hours) at 03/18/2019 1549 Last data filed at 03/18/2019 0400 Gross per 24 hour  Intake 3374.68 ml  Output 150 ml  Net 3224.68 ml   Last 3 Weights 03/07/2019 03/12/2019  Weight (lbs) 139 lb 5.3 oz 167 lb  Weight (kg) 63.2 kg 75.751 kg     Body mass index is 17.42 kg/m.  General: Thin, unkept man lying in bed.  He is intubated and unresponsive to verbal and tactile stimuli. HEENT: Endotracheal tube in place. Lymph: no adenopathy Neck: Unable to assess JVP due to support devices. Endocrine:  No thryomegaly Vascular: No carotid bruits; 1+ radial and pedal pulses bilaterally. Cardiac: Distant heart sounds with 1/6 systolic murmur.  No rubs or gallops. Lungs: Clear anteriorly. Abd: Hypoactive bowel sounds.  Scaphoid abdomen.  No tenderness. Ext: no edema Musculoskeletal: No obvious deformity.  Unable to assess strength, as patient is sedated. Skin: Dry skin with multiple blisters and small areas of hemorrhage involving the hands, feet, and nose. Neuro: Intubated and sedated. Psych: Unable to perform, as the patient is intubated and sedated.  EKG:  The EKG was personally reviewed and demonstrates: Normal sinus rhythm with early R wave transition and borderline LVH.  Relevant CV  Studies: Echocardiogram (03/27/2019):  1. The left ventricle has low normal systolic function, with an ejection fraction of 50-55%. The cavity size was normal. Left ventricular diastolic Doppler parameters are consistent with impaired relaxation.  2. The right ventricle has normal systolic function. The cavity was normal. There is no increase in right ventricular wall thickness. Right ventricular systolic pressure could not be assessed.  3. The mitral valve was not well visualized. There is moderate mitral annular calcification present.  4. The aortic valve was not well visualized. Mild thickening of the aortic valve. Mild calcification of the aortic valve. Aortic valve regurgitation was not assessed by color flow Doppler. Mild stenosis of the aortic valve.  5. No clear vegetations but very suboptimal study. TEE should be considered if there is strong suspicion for endocarditis.  LHC (30/17/2020, [facility]): Mild, non-flow-limiting coronary artery disease with 30 to 40% proximal LAD/diagonal disease.  LVEDP 11.  Laboratory Data:  Chemistry Recent Labs  Lab 03/18/2019 1938 03/17/19 0017 03/17/19 1436 03/17/19 2047 03/18/19 0422  NA 133* 134*  --   --  136  K <2.0* 2.3* 2.4* 2.8* 3.7  CL 99 104  --   --  106  CO2 17* 16*  --   --  16*  GLUCOSE 135* 92  --   --  80  BUN 62* 60*  --   --  55*  CREATININE 4.28* 3.56*  --   --  2.77*  CALCIUM 7.0* 7.2*  --   --  7.4*  GFRNONAA 14* 17*  --   --  23*  GFRAA 16* 20*  --   --  27*  ANIONGAP 17* 14  --   --  14    Recent Labs  Lab 03/22/2019 1216  PROT 5.1*  ALBUMIN 1.7*  AST 57*  ALT 18  ALKPHOS 123  BILITOT 0.7   Hematology Recent Labs  Lab 03/06/2019 2056 03/17/19 0636 03/18/19 0422  WBC 24.0* 21.0* 21.6*  RBC 2.65* 2.79* 2.66*  HGB 8.2* 8.5* 8.3*  HCT 23.5* 25.0* 23.7*  MCV 88.7 89.6 89.1  MCH 30.9 30.5 31.2  MCHC 34.9 34.0 35.0  RDW 14.2 14.3 14.5  PLT 126* 115* 90*   Cardiac EnzymesNo results for input(s): TROPONINI in the  last 168 hours. No results for input(s): TROPIPOC in the last 168 hours.  BNPNo results for input(s): BNP, PROBNP in the last 168 hours.  DDimer No results for input(s): DDIMER in the last 168 hours.  Radiology/Studies:  Dg Elbow 2 Views Left  Result Date: 03/22/2019 CLINICAL DATA:  Pain with movement EXAM: LEFT ELBOW - 2 VIEW COMPARISON:  None. FINDINGS: The joint effusion is identified with displacement of the anterior fat pad. No fractures is identified. No bony erosion or bony lesion. IMPRESSION: Joint effusion of uncertain etiology.  No bony abnormality noted. Electronically Signed   By: Dorise Bullion III M.D   On: 03/26/2019 13:00   Dg Elbow 2 Views Right  Result Date: 03/15/2019 CLINICAL DATA:  Possible sepsis. Shortness of breath. Numerous festering wounds. Open wound posterior right elbow and at medial surface of right ankle. Pain in left elbow with movement. EXAM: RIGHT ELBOW - 2 VIEW COMPARISON:  None. FINDINGS: There is a wound over the olecranon. There is mild sclerosis in the underlying olecranon without identified bony erosion. There is suggestion of a possible small joint effusion. No fractures. IMPRESSION: 1. There is a deep wound posterior to the olecranon. Sclerosis in the underlying olecranon is identified without bony erosion. The sclerosis could represent sequela of previous osteomyelitis. An MRI could further assess if clinically warranted. 2. Suspected small joint effusion. 3. No fractures. Electronically Signed   By: Dorise Bullion III M.D   On: 03/03/2019 12:56   Dg Ankle 2 Views Right  Result Date: 03/15/2019 CLINICAL DATA:  Evaluate for sepsis. EXAM: RIGHT ANKLE - 2 VIEW COMPARISON:  None. FINDINGS: No fracture or dislocation. The ankle mortise is intact. Reported skin defect over the medial surface of the right ankle is not visualized on this study. No bony erosion or evidence of osteomyelitis. IMPRESSION: No evidence of osteomyelitis. Electronically Signed   By: Dorise Bullion III M.D   On: 03/15/2019 12:59   Dg Abd 1 View  Result Date: 03/18/2019 CLINICAL DATA:  Nasogastric tube placement. EXAM: ABDOMEN - 1 VIEW COMPARISON:  Radiographs Mar 17, 2019. FINDINGS: Mildly dilated small bowel loops are noted in the left side of the abdomen which may represent ileus or possibly distal small bowel obstruction. Distal tip  of nasogastric tube is seen in expected position of proximal stomach. Side hole appears to be just above gastroesophageal junction. IMPRESSION: Distal tip of nasogastric tube appears to be in proximal stomach with side hole just above gastroesophageal junction; advancement is recommended. Mildly dilated small bowel loops are noted concerning for ileus or possibly distal small bowel obstruction. Electronically Signed   By: Marijo Conception M.D.   On: 03/18/2019 08:10   Ct Head Wo Contrast  Result Date: 03/09/2019 CLINICAL DATA:  63 year old with unexplained acute mental status changes as the patient is unable to speak clearly. Patient also presents with respiratory distress and has widespread abrasions. EXAM: CT HEAD WITHOUT CONTRAST TECHNIQUE: Contiguous axial images were obtained from the base of the skull through the vertex without intravenous contrast. COMPARISON:  None. FINDINGS: Brain: Ventricular system normal in size and appearance for age. Very small old lacunar stroke in the LEFT thalamus. Mild changes of small vessel disease of the white matter diffusely. No mass lesion. No midline shift. No acute hemorrhage or hematoma. No extra-axial fluid collections. No evidence of acute infarction. Vascular: Mild BILATERAL vertebral artery atherosclerosis. No hyperdense vessel. Skull: No skull fracture or other focal osseous abnormality involving the skull. Sinuses/Orbits: Mucous retention cyst or polyp in the RIGHT maxillary sinus. Remaining visualized paranasal sinuses, BILATERAL mastoid air cells and BILATERAL middle ear cavities well-aerated. Visualized orbits  and globes unremarkable. Other: None. IMPRESSION: 1. No acute intracranial abnormality. 2. Mild chronic microvascular ischemic changes of the white matter and very small old lacunar stroke in the left thalamus. 3. Mild chronic right maxillary sinus disease. Electronically Signed   By: Evangeline Dakin M.D.   On: 03/17/2019 13:35   US Renal  Result Date: 03/17/2019 CLINICAL DATA:  Acute kidney injury EXAM: RENAL / URINARY TRACT ULTRASOUND COMPLETE COMPARISON:  None. FINDINGS: Right Kidney: Renal measurements: 10.6 x 4.7 x 4.4 cm = volume: 114 mL . Echogenicity within normal limits. No mass or hydronephrosis visualized. 15 mm right upper pole cyst. Left Kidney: Renal measurements: 9.9 x 5.3 by 4.7 cm = volume: 127 mL. Echogenicity within normal limits. No mass or hydronephrosis visualized. Bladder: Diffuse bladder wall thickening, mild. Ascites around the liver IMPRESSION: Negative for renal mass or obstruction.  Bladder wall thickening Ascites Electronically Signed   By: Franchot Gallo M.D.   On: 03/17/2019 13:44   Mr Elbow Right Wo Contrast  Result Date: 03/17/2019 CLINICAL DATA:  Wound overlying the olecranon process. Evaluate for osteomyelitis. EXAM: MRI OF THE RIGHT ELBOW WITHOUT CONTRAST TECHNIQUE: Multiplanar, multisequence MR imaging of the elbow was performed. No intravenous contrast was administered. COMPARISON:  None. FINDINGS: TENDONS Common forearm flexor origin: Intact. Common forearm extensor origin: Mild tendinosis of the common extensor tendon origin. Biceps: Intact. Triceps: Intact. LIGAMENTS Medial stabilizers: Intact. Lateral stabilizers:  Intact. Cartilage: Small focal area of cartilage loss involving the ulnar aspect of the radial head with subchondral reactive marrow changes. Joint: Large elbow joint effusion. Cubital tunnel: Normal. Bones: Large soft tissue wound overlying the olecranon process extending to the cortex. Single focal area of cortical irregularity and subcortical marrow  edema involving the posterior olecranon on (image 14/series 7) concerning for an area of reactive marrow edema versus mild early osteomyelitis. 9.2 x 15 mm fluid collection along the posterior superficial aspect of the triceps tendon which extends through the triceps tendon into the posterior fat pad with edema throughout the posterior fat pad. IMPRESSION: 1. Large soft tissue wound overlying the olecranon process extending to the  cortex. Single focal area of cortical irregularity and subcortical marrow edema involving the posterior olecranon on (image 14/series 7) concerning for an area of reactive marrow edema versus mild early osteomyelitis. 9.2 x 15 mm fluid collection along the posterior superficial aspect of the triceps tendon which extends through the triceps tendon into the posterior fat pad with edema throughout the posterior fat pad concerning for an abscess. Large elbow joint effusion which may be reactive, but is most concerning for septic arthritis. Electronically Signed   By: Kathreen Devoid   On: 03/17/2019 13:50   Dg Chest Port 1 View  Result Date: 03/18/2019 CLINICAL DATA:  Status post intubation. EXAM: PORTABLE CHEST 1 VIEW COMPARISON:  Radiograph of Mar 16, 2019. FINDINGS: Stable cardiomediastinal silhouette. Endotracheal and nasogastric tubes are in grossly good position. No pneumothorax is noted. Left lung is clear. Minimal right basilar subsegmental atelectasis or infiltrate is noted. Bony thorax is unremarkable. IMPRESSION: Endotracheal and nasogastric tubes are in grossly good position. Minimal right basilar subsegmental atelectasis or infiltrate. Electronically Signed   By: Marijo Conception M.D.   On: 03/18/2019 08:09   Dg Chest Port 1 View  Result Date: 03/24/2019 CLINICAL DATA:  Sepsis. EXAM: PORTABLE CHEST 1 VIEW COMPARISON:  None FINDINGS: The thoracic aorta is prominent tortuous. The heart, hila, and mediastinum are unremarkable. The left lung is clear. Mild opacity in the right mid  lung. Mild atelectasis in the right base. No other acute abnormalities. IMPRESSION: 1. Possible mild opacity in the right mid lung could represent subtle pneumonia. A better volume PA and lateral chest x-ray may better evaluate. 2. Prominent tortuous thoracic aorta. Electronically Signed   By: Dorise Bullion III M.D   On: 03/22/2019 12:58   Dg Abd Portable 1v  Result Date: 03/17/2019 CLINICAL DATA:  Screening for metallic foreign bodies prior to MRI EXAM: PORTABLE ABDOMEN - 1 VIEW COMPARISON:  None. FINDINGS: The bowel gas pattern is normal. No radio-opaque calculi or other significant radiographic abnormality are seen. No definitive metallic foreign body is noted. Degenerative changes of lumbar spine are noted. IMPRESSION: No definitive metallic foreign body is identified. Electronically Signed   By: Inez Catalina M.D.   On: 03/17/2019 10:08    Assessment and Plan:   MRSA bacteremia and possible endocarditis: Patient's history and exam findings are certainly concerning for endocarditis in the setting of MRSA bacteremia.  Internal medicine, critical care, and ID are following, as well as orthopedics for management of left elbow wound.  Plan for transesophageal echocardiogram tomorrow with Dr. Rockey Situ.  As patient is currently intubated and sedated, the procedure was reviewed with his friend and medical decision maker, Leonard Peters.  Leonard Peters provided informed verbal consent on behalf of the patient after a thorough discussion of the risks and benefits of TEE.  We will repeat CBC and also check INR in the morning.  Continue antimicrobial therapy per ID.  Marfan syndrome, thoracic aortic aneurysm, and thoracoabdominal aortic dissection: TAA will be better characterized on tomorrow's TEE.  Tight blood pressure control will need to be maintained pending vascular surgery intervention.  However, in the setting of bacteremia, I doubt that the patient would be able to undergo any surgical repair (open or  endovascular) of his TAAA.  Close blood pressure monitoring, with target systolic blood pressure less than 120 mmHg.Marland Kitchen  Severe sepsis: Likely due to MRSA bacteremia complicated by dehydration from C. difficile colitis.  LVEF normal by echo during this admission.  Continue aggressive fluid hydration.  Antimicrobial therapy per ID, CCM, and IM.  Acute respiratory failure with hypoxia: Patient in emergently intubated due to worsening respiratory failure and metabolic acidosis.  Vent management per CCM.  Wean as tolerated.  Acute kidney injury: Baseline creatinine 0.6-0.7 based on labs at [facility] in March.  Renal function gradually improving since admission, with creatinine down to 2.8 today.  Continue IV hydration.  Avoid nephrotoxic drugs.  For questions or updates, please contact [place] Please consult [email or web address] for contact info under [facility] Cardiology.  Signed, Nelva Bush, MD  03/18/2019 3:49 PM

## 2019-03-18 NOTE — Progress Notes (Addendum)
CRITICAL CARE NOTE  CC  Acute  respiratory failure  SUBJECTIVE Patient  critically ill Prognosis is guarded Progressive resp failure Progressive encephalopathy ?seizures Emergently intubated Now on full vent support likely DT's    BP (!) 116/50   Pulse (!) 137   Temp (!) 97.5 F (36.4 C) (Axillary)   Resp (!) 22   Ht 6\' 3"  (1.905 m)   Wt 63.2 kg   SpO2 95%   BMI 17.42 kg/m    I/O last 3 completed shifts: In: 5322.9 [P.O.:720; I.V.:2235.1; IV Piggyback:2367.8] Out: 350 [Urine:350] No intake/output data recorded.  SpO2: 95 % O2 Flow Rate (L/min): 15 L/min FiO2 (%): 40 %    SIGNIFICANT EVENTS 5/17 admitted for sepsis 5/18 C diff POS, MRSA +bacteremia, MRI c/w RT elbow OSTEO 5/19 DT's and Severe resp failure, ETT 7.5, aspiration pneumonia  REVIEW OF SYSTEMS  PATIENT IS UNABLE TO PROVIDE COMPLETE REVIEW OF SYSTEMS DUE TO SEVERE CRITICAL ILLNESS   PHYSICAL EXAMINATION:  GENERAL:critically ill appearing, +resp distress HEAD: Normocephalic, atraumatic.  EYES: Pupils equal, round, reactive to light.  No scleral icterus.  MOUTH: Moist mucosal membrane. NECK: Supple. No thyromegaly. No nodules. No JVD.  PULMONARY: +rhonchi, +wheezing CARDIOVASCULAR: S1 and S2. Regular rate and rhythm. No murmurs, rubs, or gallops.  GASTROINTESTINAL: Soft, nontender, -distended. No masses. Positive bowel sounds. No hepatosplenomegaly.  MUSCULOSKELETAL: No swelling, clubbing, or edema.  NEUROLOGIC: obtunded, GCS<8 SKIN:intact,warm,dry  MEDICATIONS: I have reviewed all medications and confirmed regimen as documented   CULTURE RESULTS   Recent Results (from the past 240 hour(s))  Blood Culture (routine x 2)     Status: None (Preliminary result)   Collection Time: 06-13-19 12:16 PM  Result Value Ref Range Status   Specimen Description BLOOD BLOOD RIGHT FOREARM  Final   Special Requests   Final    BOTTLES DRAWN AEROBIC AND ANAEROBIC Blood Culture results may not be optimal due  to an excessive volume of blood received in culture bottles   Culture  Setup Time   Final    GRAM POSITIVE COCCI IN BOTH AEROBIC AND ANAEROBIC BOTTLES CRITICAL RESULT CALLED TO, READ BACK BY AND VERIFIED WITH: JASON ROBBINS ON 03/17/2019 AT 0012 QSD Performed at [facility], [address] [place], [place] [address]    Culture GRAM POSITIVE COCCI  Final   Report Status PENDING  Incomplete  Blood Culture (routine x 2)     Status: None (Preliminary result)   Collection Time: 06-13-19 12:16 PM  Result Value Ref Range Status   Specimen Description   Final    BLOOD LAC Performed at [facility], [address] [place], [place] [address]    Special Requests   Final    BOTTLES DRAWN AEROBIC AND ANAEROBIC Blood Culture results may not be optimal due to an excessive volume of blood received in culture bottles Performed at [facility], [address] [place], [place] [address]    Culture  Setup Time   Final    GRAM POSITIVE COCCI IN BOTH AEROBIC AND ANAEROBIC BOTTLES CRITICAL RESULT CALLED TO, READ BACK BY AND VERIFIED WITH: JASON ROBBINS ON 03/17/2019 AT 0012 QSD Performed at [facility], [address] [place], [place] [address]    [facility]  Final   Report Status PENDING  Incomplete  Urine culture     Status: Abnormal (Preliminary result)   Collection Time: 06-13-19 12:16 PM  Result Value Ref Range Status   Specimen Description  Final    URINE, RANDOM Performed at [facility], [address] [place], [place] [address]    Special Requests   Final    NONE Performed at [facility], [address] [place], [place] [address]    Culture (A)  Final    >=100,000 COLONIES/mL STAPHYLOCOCCUS AUREUS SUSCEPTIBILITIES TO FOLLOW Performed at [facility], [address] [place], [place] [address]    Report Status PENDING  Incomplete  SARS Coronavirus 2 (CEPHEID- Performed in [facility]),  Hosp Order     Status: None   Collection Time: 01-08-19 12:16 PM  Result Value Ref Range Status   SARS Coronavirus 2 NEGATIVE NEGATIVE Final    Comment: (NOTE) If result is NEGATIVE SARS-CoV-2 target nucleic acids are NOT DETECTED. The SARS-CoV-2 RNA is generally detectable in upper and lower  respiratory specimens during the acute phase of infection. The lowest  concentration of SARS-CoV-2 viral copies this assay can detect is 250  copies / mL. A negative result does not preclude SARS-CoV-2 infection  and should not be used as the sole basis for treatment or other  patient management decisions.  A negative result may occur with  improper specimen collection / handling, submission of specimen other  than nasopharyngeal swab, presence of viral mutation(s) within the  areas targeted by this assay, and inadequate number of viral copies  (<250 copies / mL). A negative result must be combined with clinical  observations, patient history, and epidemiological information. If result is POSITIVE SARS-CoV-2 target nucleic acids are DETECTED. The SARS-CoV-2 RNA is generally detectable in upper and lower  respiratory specimens dur ing the acute phase of infection.  Positive  results are indicative of active infection with SARS-CoV-2.  Clinical  correlation with patient history and other diagnostic information is  necessary to determine patient infection status.  Positive results do  not rule out bacterial infection or co-infection with other viruses. If result is PRESUMPTIVE POSTIVE SARS-CoV-2 nucleic acids MAY BE PRESENT.   A presumptive positive result was obtained on the submitted specimen  and confirmed on repeat testing.  While 2019 novel coronavirus  (SARS-CoV-2) nucleic acids may be present in the submitted sample  additional confirmatory testing may be necessary for epidemiological  and / or clinical management purposes  to differentiate between  SARS-CoV-2 and other Sarbecovirus  currently known to infect humans.  If clinically indicated additional testing with an alternate test  methodology [phone]([id number]) is advised. The SARS-CoV-2 RNA is generally  detectable in upper and lower respiratory sp ecimens during the acute  phase of infection. The expected result is Negative. Fact Sheet for Patients:  BoilerBrush.com.cyhttps://www.fda.gov/media/136312/download Fact Sheet for Healthcare Providers: https://pope.com/https://www.fda.gov/media/136313/download This test is not yet approved or cleared by the [place] FDA and has been authorized for detection and/or diagnosis of SARS-CoV-2 by FDA under an Emergency Use Authorization (EUA).  This EUA will remain in effect (meaning this test can be used) for the duration of the COVID-19 declaration under Section 564(b)(1) of the Act, 21 U.S.C. section 360bbb-3(b)(1), unless the authorization is terminated or revoked sooner. Performed at [facility], [address] [place], [place] [address]   Blood Culture ID Panel (Reflexed)     Status: Abnormal   Collection Time: 01-08-19 12:16 PM  Result Value Ref Range Status   Enterococcus species NOT DETECTED NOT DETECTED Final   Listeria monocytogenes NOT DETECTED NOT DETECTED Final   Staphylococcus species DETECTED (A) NOT DETECTED Final  Comment: CRITICAL RESULT CALLED TO, READ BACK BY AND VERIFIED WITH: JASON ROBBINS ON 03/17/2019 AT 0012 QSD    Staphylococcus aureus (BCID) DETECTED (A) NOT DETECTED Final    Comment: Methicillin (oxacillin)-resistant Staphylococcus aureus (MRSA). MRSA is predictably resistant to beta-lactam antibiotics (except ceftaroline). Preferred therapy is vancomycin unless clinically contraindicated. Patient requires contact precautions if  hospitalized. CRITICAL RESULT CALLED TO, READ BACK BY AND VERIFIED WITH: JASON ROBBINS ON 03/17/2019 AT 0012 QSD    Methicillin resistance DETECTED (A) NOT DETECTED Final    Comment: CRITICAL RESULT CALLED TO, READ BACK BY AND VERIFIED  WITH: JASON ROBBINS ON 03/17/2019 AT 0012 QSD    Streptococcus species NOT DETECTED NOT DETECTED Final   Streptococcus agalactiae NOT DETECTED NOT DETECTED Final   Streptococcus pneumoniae NOT DETECTED NOT DETECTED Final   Streptococcus pyogenes NOT DETECTED NOT DETECTED Final   Acinetobacter baumannii NOT DETECTED NOT DETECTED Final   Enterobacteriaceae species NOT DETECTED NOT DETECTED Final   Enterobacter cloacae complex NOT DETECTED NOT DETECTED Final   Escherichia coli NOT DETECTED NOT DETECTED Final   Klebsiella oxytoca NOT DETECTED NOT DETECTED Final   Klebsiella pneumoniae NOT DETECTED NOT DETECTED Final   Proteus species NOT DETECTED NOT DETECTED Final   Serratia marcescens NOT DETECTED NOT DETECTED Final   Haemophilus influenzae NOT DETECTED NOT DETECTED Final   Neisseria meningitidis NOT DETECTED NOT DETECTED Final   Pseudomonas aeruginosa NOT DETECTED NOT DETECTED Final   Candida albicans NOT DETECTED NOT DETECTED Final   Candida glabrata NOT DETECTED NOT DETECTED Final   Candida krusei NOT DETECTED NOT DETECTED Final   Candida parapsilosis NOT DETECTED NOT DETECTED Final   Candida tropicalis NOT DETECTED NOT DETECTED Final    Comment: Performed at [facility], [address] [place], [place] [address]  MRSA PCR Screening     Status: Abnormal   Collection Time: Oct 03, 2019  6:16 PM  Result Value Ref Range Status   MRSA by PCR POSITIVE (A) NEGATIVE Final    Comment:        The GeneXpert MRSA Assay (FDA approved for NASAL specimens only), is one component of a comprehensive MRSA colonization surveillance program. It is not intended to diagnose MRSA infection nor to guide or monitor treatment for MRSA infections. RESULT CALLED TO, READ BACK BY AND VERIFIED WITH: PREDOME,F AT 2001 ON 10-09-19 BY MOSLEY,J Performed at [facility], [address] [place], [place] [address]   C difficile quick scan w PCR reflex     Status: Abnormal   Collection  Time: 03/17/19  4:45 AM  Result Value Ref Range Status   C Diff antigen POSITIVE (A) NEGATIVE Final   C Diff toxin POSITIVE (A) NEGATIVE Final    Comment: RESULT CALLED TO, READ BACK BY AND VERIFIED WITH: FELICIA PREUDHOMME @0545  03/17/19 AKT    C Diff interpretation Toxin producing C. difficile detected.  Final    Comment: Performed at [facility], [address] [place], [place] [address]          IMAGING    [place] Renal  Result Date: 03/17/2019 CLINICAL DATA:  Acute kidney injury EXAM: RENAL / URINARY TRACT ULTRASOUND COMPLETE COMPARISON:  None. FINDINGS: Right Kidney: Renal measurements: 10.6 x 4.7 x 4.4 cm = volume: 114 mL . Echogenicity within normal limits. No mass or hydronephrosis visualized. 15 mm right upper pole cyst. Left Kidney: Renal measurements: 9.9 x 5.3 by 4.7 cm = volume: 127 mL. Echogenicity within normal limits. No mass or hydronephrosis  visualized. Bladder: Diffuse bladder wall thickening, mild. Ascites around the liver IMPRESSION: Negative for renal mass or obstruction.  Bladder wall thickening Ascites Electronically Signed   By: Marlan Palau M.D.   On: 03/17/2019 13:44   Mr Elbow Right Wo Contrast  Result Date: 03/17/2019 CLINICAL DATA:  Wound overlying the olecranon process. Evaluate for osteomyelitis. EXAM: MRI OF THE RIGHT ELBOW WITHOUT CONTRAST TECHNIQUE: Multiplanar, multisequence MR imaging of the elbow was performed. No intravenous contrast was administered. COMPARISON:  None. FINDINGS: TENDONS Common forearm flexor origin: Intact. Common forearm extensor origin: Mild tendinosis of the common extensor tendon origin. Biceps: Intact. Triceps: Intact. LIGAMENTS Medial stabilizers: Intact. Lateral stabilizers:  Intact. Cartilage: Small focal area of cartilage loss involving the ulnar aspect of the radial head with subchondral reactive marrow changes. Joint: Large elbow joint effusion. Cubital tunnel: Normal. Bones: Large soft tissue wound overlying the  olecranon process extending to the cortex. Single focal area of cortical irregularity and subcortical marrow edema involving the posterior olecranon on (image 14/series 7) concerning for an area of reactive marrow edema versus mild early osteomyelitis. 9.2 x 15 mm fluid collection along the posterior superficial aspect of the triceps tendon which extends through the triceps tendon into the posterior fat pad with edema throughout the posterior fat pad. IMPRESSION: 1. Large soft tissue wound overlying the olecranon process extending to the cortex. Single focal area of cortical irregularity and subcortical marrow edema involving the posterior olecranon on (image 14/series 7) concerning for an area of reactive marrow edema versus mild early osteomyelitis. 9.2 x 15 mm fluid collection along the posterior superficial aspect of the triceps tendon which extends through the triceps tendon into the posterior fat pad with edema throughout the posterior fat pad concerning for an abscess. Large elbow joint effusion which may be reactive, but is most concerning for septic arthritis. Electronically Signed   By: Elige Ko   On: 03/17/2019 13:50   Dg Abd Portable 1v  Result Date: 03/17/2019 CLINICAL DATA:  Screening for metallic foreign bodies prior to MRI EXAM: PORTABLE ABDOMEN - 1 VIEW COMPARISON:  None. FINDINGS: The bowel gas pattern is normal. No radio-opaque calculi or other significant radiographic abnormality are seen. No definitive metallic foreign body is noted. Degenerative changes of lumbar spine are noted. IMPRESSION: No definitive metallic foreign body is identified. Electronically Signed   By: Alcide Clever M.D.   On: 03/17/2019 10:08   MICRO RESULTS May 18 COVID test negative May 18 MRSA positive May 18 C. difficile positive May 18 blood cultures positive for MRSA     Indwelling Urinary Catheter continued, requirement due to   Reason to continue Indwelling Urinary Catheter strict Intake/Output  monitoring for hemodynamic instability         Ventilator continued, requirement due to severe respiratory failure   Ventilator Sedation RASS 0 to -2      ASSESSMENT AND PLAN SYNOPSIS 63 yo WM with acute metabolic encephalopathy from severe metabolic acidosis and severe sepsis with RT mid opacity likely pneumonia(?mass) with left elbow effusion and multiple skin lesions complicated by acute renal failure associated with malnourishment and FTT. Now complicated by acute resp failure requiring intubation with DT's and aspiration pneumonia  Severe ACUTE Hypoxic and Hypercapnic Respiratory Failure -continue Full MV support -continue Bronchodilator Therapy -Wean Fio2 and PEEP as tolerated  NEUROLOGY - intubated and sedated - minimal sedation to achieve a RASS goal: -1 Severe DT's   Severe Sepsis/SHOCK-SEPSIS/HYPOVOLUMIC -use vasopressors to keep MAP>65 if needed -follow ABG and  LA -follow up cultures -emperic ABX -consider stress dose steroids -aggressive IV fluid resuscitation  CARDIAC ICU monitoring  ID -continue IV abx as prescibed -follow up cultures MRSA bacteremia-plan for TEE C diff colitis-oral vancomycin and IV flagyl COVID-19 NEGATIVE: Acute COVID-19 infection ruled out by PCR.  HIV NEG Repeat blood cultures MRI suggests Septic Arthritis and Osteomyelitis-will consider ORTHO CONSULT  GI GI PROPHYLAXIS as indicated  NUTRITIONAL STATUS DIET-->TF's as tolerated Constipation protocol as indicated  ENDO - will use ICU hypoglycemic\Hyperglycemia protocol if indicated   ELECTROLYTES -follow labs as needed -replace as needed -pharmacy consultation and following   DVT/GI PRX ordered TRANSFUSIONS AS NEEDED MONITOR FSBS ASSESS the need for LABS as needed   Critical Care Time devoted to patient care services described in this note is 45 minutes.   Overall, patient is critically ill, prognosis is guarded.  Patient with Multiorgan failure and at high risk  for cardiac arrest and death.    Lucie Leather, M.D.  [facility]  Medical Director [facility] Medical Director [facility] Cardio-Pulmonary Department

## 2019-03-18 NOTE — Progress Notes (Signed)
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  During that hospitalization he also was positive for C. difficile.  He was evaluated by CT surgery and was determined to be elective aortic surgery and sent home to come back and have his ascending aortic replacement.  He presents to our ED brought in by the EMS as he was confused, incoherent and was covered from head to toe with wounds.   ?  Acute resp failure-  intubated and on vent  ?MRSA bacteremia- has signs of embolic manifestation to the fingers and toes- janeway bodies?? 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 Plan was to do surgery at [facility] but he did not follow up Concern for infected thrombus   Marfans syndrome ( mentioned in [facility] record  Cdiff diarrhea- was treated in March  for cdiff- positive here and on IV metronidazole- once PO allowed will have to go on Po vanco or PO fidoximicin  B/l purulent ear discharge- CT head did not reveal any acute abnormality- may need mastoid/inner ear focused films  Hypokalemia/ hyponatremia- could be related to alcohol abuse. being corrected  Hypoalbuminemia  HIV _NR  Discussed the management with his nurse and pharmacist

## 2019-03-18 NOTE — Progress Notes (Signed)
Pt unresponsive to voice.  Eyes turned inward, down, then upwards in sync.  Pt with tachypnea, increasing hypoxia.  Danan, NP to bedside.  Pt responsive and following commands.  Ativan 2mg  IVP administered x1.

## 2019-03-18 NOTE — Progress Notes (Signed)
[facility] - [place] at [facility]   PATIENT NAME: Jaquelyn BitterRichard Yon    MR#:  [id number]  DATE OF BIRTH:  10/28/1956  SUBJECTIVE:  CHIEF COMPLAINT:   Chief Complaint  Patient presents with  . Altered Mental Status  Patient seen and evaluated today Intubated and on ventilator   REVIEW OF SYSTEMS:    ROS Unable to obtain as patient is critically ill DRUG ALLERGIES:   Allergies  Allergen Reactions  . Lipitor [Atorvastatin Calcium]     Elevated LFTs    VITALS:  Blood pressure (!) 96/57, pulse (!) 136, temperature 99 F (37.2 C), temperature source Oral, resp. rate 15, height 6\' 3"  (1.905 m), weight 63.2 kg, SpO2 100 %.  PHYSICAL EXAMINATION:   Physical Exam  GENERAL:  63 y.o.-year-old patient lying in the bed on ventilator EYES: Pupils equal, round, reactive to light and accommodation. No scleral icterus. Extraocular muscles intact.  HEENT: Head atraumatic, normocephalic. Oropharynx and nasopharynx clear.  NECK:  Supple, no jugular venous distention. No thyroid enlargement, no tenderness.  LUNGS: Decreased breath sounds bilaterally, rales heard in both lungs Tidal volume 450 Rate 15 PEEP 5 FiO2 40% CARDIOVASCULAR: S1, S2 normal. No murmurs, rubs, or gallops.  ABDOMEN: Soft, nontender, nondistended. Bowel sounds present. No organomegaly or mass.  EXTREMITIES: No cyanosis, clubbing or edema b/l.    NEUROLOGIC: On ventilator could not be assessed PSYCHIATRIC: The patient is alert and oriented none SKIN: No obvious rash, lesion, or ulcer.   LABORATORY PANEL:   CBC Recent Labs  Lab 03/18/19 0422  WBC 21.6*  HGB 8.3*  HCT 23.7*  PLT 90*   ------------------------------------------------------------------------------------------------------------------ Chemistries  Recent Labs  Lab 18-Sep-2019 1216  03/18/19 0422  NA 131*   < > 136  K 2.1*   < > 3.7  CL 92*   < > 106  CO2 11*   < > 16*  GLUCOSE 137*   < > 80  BUN 58*   < > 55*  CREATININE 4.60*    < > 2.77*  CALCIUM 7.6*   < > 7.4*  MG  --    < > 1.9  AST 57*  --   --   ALT 18  --   --   ALKPHOS 123  --   --   BILITOT 0.7  --   --    < > = values in this interval not displayed.   ------------------------------------------------------------------------------------------------------------------  Cardiac Enzymes No results for input(s): TROPONINI in the last 168 hours. ------------------------------------------------------------------------------------------------------------------  RADIOLOGY:  Dg Elbow 2 Views Left  Result Date: 09/10/19 CLINICAL DATA:  Pain with movement EXAM: LEFT ELBOW - 2 VIEW COMPARISON:  None. FINDINGS: The joint effusion is identified with displacement of the anterior fat pad. No fractures is identified. No bony erosion or bony lesion. IMPRESSION: Joint effusion of uncertain etiology.  No bony abnormality noted. Electronically Signed   By: Gerome Samavid  Williams III M.D   On: 19-Nov-202020 13:00   Dg Elbow 2 Views Right  Result Date: 09/10/19 CLINICAL DATA:  Possible sepsis. Shortness of breath. Numerous festering wounds. Open wound posterior right elbow and at medial surface of right ankle. Pain in left elbow with movement. EXAM: RIGHT ELBOW - 2 VIEW COMPARISON:  None. FINDINGS: There is a wound over the olecranon. There is mild sclerosis in the underlying olecranon without identified bony erosion. There is suggestion of a possible small joint effusion. No fractures. IMPRESSION: 1. There is a deep wound posterior to the olecranon. Sclerosis  in the underlying olecranon is identified without bony erosion. The sclerosis could represent sequela of previous osteomyelitis. An MRI could further assess if clinically warranted. 2. Suspected small joint effusion. 3. No fractures. Electronically Signed   By: Gerome Sam III M.D   On: 04/14/2019 12:56   Dg Ankle 2 Views Right  Result Date: 2019-04-14 CLINICAL DATA:  Evaluate for sepsis. EXAM: RIGHT ANKLE - 2 VIEW  COMPARISON:  None. FINDINGS: No fracture or dislocation. The ankle mortise is intact. Reported skin defect over the medial surface of the right ankle is not visualized on this study. No bony erosion or evidence of osteomyelitis. IMPRESSION: No evidence of osteomyelitis. Electronically Signed   By: Gerome Sam III M.D   On: 04-14-2019 12:59   Dg Abd 1 View  Result Date: 03/18/2019 CLINICAL DATA:  Nasogastric tube placement. EXAM: ABDOMEN - 1 VIEW COMPARISON:  Radiographs Mar 17, 2019. FINDINGS: Mildly dilated small bowel loops are noted in the left side of the abdomen which may represent ileus or possibly distal small bowel obstruction. Distal tip of nasogastric tube is seen in expected position of proximal stomach. Side hole appears to be just above gastroesophageal junction. IMPRESSION: Distal tip of nasogastric tube appears to be in proximal stomach with side hole just above gastroesophageal junction; advancement is recommended. Mildly dilated small bowel loops are noted concerning for ileus or possibly distal small bowel obstruction. Electronically Signed   By: Lupita Raider M.D.   On: 03/18/2019 08:10   Ct Head Wo Contrast  Result Date: April 14, 2019 CLINICAL DATA:  63 year old with unexplained acute mental status changes as the patient is unable to speak clearly. Patient also presents with respiratory distress and has widespread abrasions. EXAM: CT HEAD WITHOUT CONTRAST TECHNIQUE: Contiguous axial images were obtained from the base of the skull through the vertex without intravenous contrast. COMPARISON:  None. FINDINGS: Brain: Ventricular system normal in size and appearance for age. Very small old lacunar stroke in the LEFT thalamus. Mild changes of small vessel disease of the white matter diffusely. No mass lesion. No midline shift. No acute hemorrhage or hematoma. No extra-axial fluid collections. No evidence of acute infarction. Vascular: Mild BILATERAL vertebral artery atherosclerosis. No  hyperdense vessel. Skull: No skull fracture or other focal osseous abnormality involving the skull. Sinuses/Orbits: Mucous retention cyst or polyp in the RIGHT maxillary sinus. Remaining visualized paranasal sinuses, BILATERAL mastoid air cells and BILATERAL middle ear cavities well-aerated. Visualized orbits and globes unremarkable. Other: None. IMPRESSION: 1. No acute intracranial abnormality. 2. Mild chronic microvascular ischemic changes of the white matter and very small old lacunar stroke in the left thalamus. 3. Mild chronic right maxillary sinus disease. Electronically Signed   By: Hulan Saas M.D.   On: 04/14/19 13:35   US Renal  Result Date: 03/17/2019 CLINICAL DATA:  Acute kidney injury EXAM: RENAL / URINARY TRACT ULTRASOUND COMPLETE COMPARISON:  None. FINDINGS: Right Kidney: Renal measurements: 10.6 x 4.7 x 4.4 cm = volume: 114 mL . Echogenicity within normal limits. No mass or hydronephrosis visualized. 15 mm right upper pole cyst. Left Kidney: Renal measurements: 9.9 x 5.3 by 4.7 cm = volume: 127 mL. Echogenicity within normal limits. No mass or hydronephrosis visualized. Bladder: Diffuse bladder wall thickening, mild. Ascites around the liver IMPRESSION: Negative for renal mass or obstruction.  Bladder wall thickening Ascites Electronically Signed   By: Marlan Palau M.D.   On: 03/17/2019 13:44   Mr Elbow Right Wo Contrast  Result Date: 03/17/2019 CLINICAL DATA:  Wound overlying the olecranon process. Evaluate for osteomyelitis. EXAM: MRI OF THE RIGHT ELBOW WITHOUT CONTRAST TECHNIQUE: Multiplanar, multisequence MR imaging of the elbow was performed. No intravenous contrast was administered. COMPARISON:  None. FINDINGS: TENDONS Common forearm flexor origin: Intact. Common forearm extensor origin: Mild tendinosis of the common extensor tendon origin. Biceps: Intact. Triceps: Intact. LIGAMENTS Medial stabilizers: Intact. Lateral stabilizers:  Intact. Cartilage: Small focal area of  cartilage loss involving the ulnar aspect of the radial head with subchondral reactive marrow changes. Joint: Large elbow joint effusion. Cubital tunnel: Normal. Bones: Large soft tissue wound overlying the olecranon process extending to the cortex. Single focal area of cortical irregularity and subcortical marrow edema involving the posterior olecranon on (image 14/series 7) concerning for an area of reactive marrow edema versus mild early osteomyelitis. 9.2 x 15 mm fluid collection along the posterior superficial aspect of the triceps tendon which extends through the triceps tendon into the posterior fat pad with edema throughout the posterior fat pad. IMPRESSION: 1. Large soft tissue wound overlying the olecranon process extending to the cortex. Single focal area of cortical irregularity and subcortical marrow edema involving the posterior olecranon on (image 14/series 7) concerning for an area of reactive marrow edema versus mild early osteomyelitis. 9.2 x 15 mm fluid collection along the posterior superficial aspect of the triceps tendon which extends through the triceps tendon into the posterior fat pad with edema throughout the posterior fat pad concerning for an abscess. Large elbow joint effusion which may be reactive, but is most concerning for septic arthritis. Electronically Signed   By: Elige KoHetal  Patel   On: 03/17/2019 13:50   Dg Chest Port 1 View  Result Date: 03/18/2019 CLINICAL DATA:  Status post intubation. EXAM: PORTABLE CHEST 1 VIEW COMPARISON:  Radiograph of Mar 16, 2019. FINDINGS: Stable cardiomediastinal silhouette. Endotracheal and nasogastric tubes are in grossly good position. No pneumothorax is noted. Left lung is clear. Minimal right basilar subsegmental atelectasis or infiltrate is noted. Bony thorax is unremarkable. IMPRESSION: Endotracheal and nasogastric tubes are in grossly good position. Minimal right basilar subsegmental atelectasis or infiltrate. Electronically Signed   By: Lupita RaiderJames   Green Jr M.D.   On: 03/18/2019 08:09   Dg Chest Port 1 View  Result Date: 2019-02-08 CLINICAL DATA:  Sepsis. EXAM: PORTABLE CHEST 1 VIEW COMPARISON:  None FINDINGS: The thoracic aorta is prominent tortuous. The heart, hila, and mediastinum are unremarkable. The left lung is clear. Mild opacity in the right mid lung. Mild atelectasis in the right base. No other acute abnormalities. IMPRESSION: 1. Possible mild opacity in the right mid lung could represent subtle pneumonia. A better volume PA and lateral chest x-ray may better evaluate. 2. Prominent tortuous thoracic aorta. Electronically Signed   By: Gerome Samavid  Williams III M.D   On: 02020-04-11 12:58   Dg Abd Portable 1v  Result Date: 03/17/2019 CLINICAL DATA:  Screening for metallic foreign bodies prior to MRI EXAM: PORTABLE ABDOMEN - 1 VIEW COMPARISON:  None. FINDINGS: The bowel gas pattern is normal. No radio-opaque calculi or other significant radiographic abnormality are seen. No definitive metallic foreign body is noted. Degenerative changes of lumbar spine are noted. IMPRESSION: No definitive metallic foreign body is identified. Electronically Signed   By: Alcide CleverMark  Lukens M.D.   On: 03/17/2019 10:08     ASSESSMENT AND PLAN:  63 year old male patient currently in the ICU for sepsis  -Acute hypoxic respiratory failure Continue mechanical ventilation Continue vent bundle PEEP as tolerated Intensivist follow-up  -Sepsis severe with hypovolemia  and shock IV fluids Broad-spectrum antibiotics Follow-up cultures IV pressors as needed Check hepatitis panel and HIV   -MRSA bacteremia Continue broad-spectrum antibiotics Will need TEE Follow-up echocardiogram results COVID test negative Infectious disease follow-up  -Right lung pneumonia with opacity Continue IV antibiotics CT chest down the line  -Acute kidney injury Avoid nephrotoxic drugs IV fluids and monitor renal function  -Right elbow cellulitis Follow-up MRI to check for any  osteomyelitis  -Adult failure to thrive Nutritional supplements  -C. difficile colitis Contact and enteric precautions IV fluids Continue Flagyl and vancomycin  -Hypotension secondary to sepsis IV fluids and IV pressors as needed  -Acute systolic heart failure Lasix as needed Cardiology recommendations   All the records are reviewed and case discussed with Care Management/Social Worker. Management plans discussed with the patient, family and they are in agreement.  CODE STATUS: Full code  DVT Prophylaxis: SCDs  TOTAL TIME TAKING CARE OF THIS PATIENT: 37 minutes.   POSSIBLE D/C IN 2 to 3 DAYS, DEPENDING ON CLINICAL CONDITION.  Ihor Austin M.D on 03/18/2019 at 10:39 AM  Between 7am to 6pm - Pager - [phone]  After 6pm go to [email or web address] - password [email or web address]  [facility]  Office  [phone]  CC: Primary care physician; System, Pcp Not In  Note: This dictation was prepared with Dragon dictation along with smaller phrase technology. Any transcriptional errors that result from this process are unintentional.

## 2019-03-18 NOTE — Procedures (Signed)
Endotracheal Intubation: Patient required placement of an artificial airway secondary to Respiratory Failure  Consent: Emergent.   Hand washing performed prior to starting the procedure.   Medications administered for sedation prior to procedure:  Midazolam 4 mg IV,  Vecuronium 10 mg IV, Fentanyl 100 mcg IV.    A time out procedure was called and correct patient, name, & ID confirmed. Needed supplies and equipment were assembled and checked to include ETT, 10 ml syringe, Glidescope, Mac and Miller blades, suction, oxygen and bag mask valve, end tidal CO2 monitor.   Patient was positioned to align the mouth and pharynx to facilitate visualization of the glottis.   Heart rate, SpO2 and blood pressure was continuously monitored during the procedure. Pre-oxygenation was conducted prior to intubation and endotracheal tube was placed through the vocal cords into the trachea.     The artificial airway was placed under direct visualization via glidescope route using a 7.5 ETT on the first attempt.  ETT was secured at 23 cm mark.  Placement was confirmed by auscuitation of lungs with good breath sounds bilaterally and no stomach sounds.  Condensation was noted on endotracheal tube.   Pulse ox 98%.  CO2 detector in place with appropriate color change.   Complications: None .   Operator: Henritta Mutz/Blakeny.   Chest radiograph ordered and pending.   Comments: OGT placed via glidescope.  Corrin Parker, M.D.  [facility]  Medical Director [place] Director [facility] Cardio-Pulmonary Department

## 2019-03-18 NOTE — Progress Notes (Signed)
Pharmacy Antibiotic Note  Leonard Peters is a 63 y.o. male admitted on 03/29/2019 with multiple infections. Patient with recent history of drug abuse and presents with clostridium difficile, MRSA bacteremia, cellulitis, UTI, and possible pneumonia. Patient with left heart catheterization and clostridium difficile infection in March 2020. Pharmacy has been consulted for vancomycin dosing.  Plan:   Patient now with possible ileus or SBO. Plan is for TEE on 5/20.   Continue vancomycin 1g IV Q48hr. Will obtain serum creatinine with am labs. As creatinine clearance/serum creatinine improve, anticipate adjusting schedule. Patient is not making significant ammounts of urine throughout shift. Will obtain vancomycin random level with am labs. Will determine dosing plan with CCM team on 5/20. Repeat blood cultures obtained on 5/19.   Vancomycin 500mg  PR Q6hr. Metronidazole 500mg  IV Q8hr continued until patient able to consistently receive oral/via tube vancomycin.   Height: 6\' 3"  (190.5 cm) Weight: 139 lb 5.3 oz (63.2 kg) IBW/kg (Calculated) : 84.5  Temp (24hrs), Avg:98.1 F (36.7 C), Min:97.5 F (36.4 C), Max:99 F (37.2 C)  Recent Labs  Lab 03/05/2019 1216 03/24/2019 1502 03/15/2019 1938 03/29/2019 2056 03/17/19 0538 03/17/19 0636 03/18/19 0422  WBC 25.6*  --   --  24.0*  --  21.0* 21.6*  CREATININE 4.60*  --  4.28*  --  3.56*  --  2.77*  LATICACIDVEN >11.0* 5.7* 1.9  --   --   --   --     Estimated Creatinine Clearance: 24.4 mL/min (A) (by C-G formula based on SCr of 2.77 mg/dL (H)).    Allergies  Allergen Reactions  . Lipitor [Atorvastatin Calcium]     Elevated LFTs    Antimicrobials this admission: Cefepime 5/17 x 1 Metronidazole 5/17, 5/18 >>  Vancomycin IV 5/17 >>  Vancomycin PO 5/18 >> 51/8, PR 5/18 >>  Ceftriaxone 5/18 x 1  Doxycycline 5/18 x 1   Dose adjustments this admission: 5/18 Vancomycin PO adjusted to 500mg  PO Q6hr.  5/19 Vancomycin PO adjusted to PR   Microbiology  results: 5/17 BCx: MRSA  5/17 UCx: >100K staph aureus  5/17 MRSA PCR: positive  5/17 COVID: negative  5/18 Clostridium Difficile: positive 5/19 BCx: sent    Thank you for allowing pharmacy to be a part of this patient's care.  Simpson,Michael L 03/18/2019 6:20 PM

## 2019-03-18 NOTE — Consult Note (Signed)
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  He is intubated and unable to give a history and history is reviewed in chart.  On examination he has a 3 cm circular wound x 1 cm deep, with some bone exposed at the base of the wound which appears necrotic.  On palpation there is extensive edema but no fluctuance was palpated and no drainage occurred with pressure.   MRI reviewed and based on his clinical exam and findings my recommendation is for wound VAC application.  This will help in his edema and fluid around the joint.  It may also allow for granulation tissue to occur over the bone. Silver foam with antibiotic properties was applied to the wound and wound VAC applied with low pressure and will need to be changed every 3 days.

## 2019-03-18 NOTE — Progress Notes (Signed)
Daily Progress Note   Patient Name: Leonard Peters       Date: 03/18/2019 DOB: [birth year]  Age: 63 y.o. MRN#: [id number] Attending Physician: Saundra Shelling, MD Primary Care Physician: System, Pcp Not In Admit Date: 03/15/2019  Reason for Consultation/Follow-up: Establishing goals of care  Subjective: Patient emergently intubated this AM and on fentanyl infusion. No signs of pain or discomfort. Unable to participate in [place] discussion.  GOC: Called placed to Arron's (goes by "Liliane Channel") neighbors, Ysidro Evert and his daughter [place]. They have known Liliane Channel for about two years. They mention Liliane Channel has a cousin but do not have her number and the last they heard, this cousin was moving to [place].  Explained that Davidmichael verbalized to this NP yesterday, if he was unable to make decisions for himself, he would wish for care team to call Ysidro Evert.   I introduced Palliative Medicine as specialized medical care for people living with serious illness. It focuses on providing relief from the symptoms and stress of a serious illness.   Debe Coder shares that Rick's wife died in 2017/11/13. They are his only support system. They describe Liliane Channel as "independent" and "solitary but active" prior to health decline in the past few months. "Did what he wanted to do."   Discussed events from recent hospitalization at [facility]. His elective surgery for aortic dissection has been rescheduled multiple times.  Since [facility] hospitalization, [place] and Ysidro Evert share that functionally he has declined to the point where he lays in bed all day. Ysidro Evert started noticing the sores ~2 weeks ago but Delfino Lovett told Ysidro Evert he was on medications for the sores. Ysidro Evert and [place] have ensured there is food in his house and his dog has food as well. They  share that his house has mold.   Discussed events leading up to admission and course of hospitalization including diagnoses, interventions, and guarded prognosis. Discussed plan of care.   I attempted to elicit values and goals of care important to the patient. Neighbors share that he has mentioned the need to complete a living will but has not yet done this. Macky has NOT spoken to them of his wishes regarding heroic interventions.   Concepts specific to code status were discussed and educated on medical recommendation for DNR with underlying frailty and chronic conditions, with fear that heroic measures at  EOL would cause pain and suffering. Explored Jeremy's thoughts on what Liliane Channel would want. Ysidro Evert states "I can't answer that right now" and requests time to "think about that." Emotional support provided.   Discussed quality of life and what this would look like for [facility].   Reassured that everything is being done to help Liliane Channel but also to 'hope for the best, but prepare for the worst.'  Questions and concerns were addressed. PMT contact information given to [place] and [place].    Length of Stay: 2  Current Medications: Scheduled Meds:  . chlorhexidine gluconate (MEDLINE KIT)  15 mL Mouth Rinse BID  . Chlorhexidine Gluconate Cloth  6 each Topical Q0600  . FLUoxetine  40 mg Per Tube Daily  . heparin injection (subcutaneous)  5,000 Units Subcutaneous Q8H  . mouth rinse  15 mL Mouth Rinse q12n4p  . mouth rinse  15 mL Mouth Rinse 10 times per day  . mupirocin ointment  1 application Nasal BID  . nutrition supplement (JUVEN)  1 packet Per Tube BID BM  . vancomycin  500 mg Oral Q6H    Continuous Infusions: . sodium chloride    . sodium chloride    . fentaNYL infusion INTRAVENOUS 100 mcg/hr (03/18/19 0711)  . banana bag IV 1000 mL    . lactated ringers    . magnesium sulfate bolus IVPB    . metronidazole    .  sodium bicarbonate (isotonic) infusion in sterile water 100 mL/hr at  03/18/19 0400  . [START ON 03/19/2019] vancomycin      PRN Meds: sodium chloride, acetaminophen **OR** acetaminophen, fentaNYL (SUBLIMAZE) injection, hydrALAZINE, LORazepam, ondansetron **OR** ondansetron (ZOFRAN) IV, polyethylene glycol, sodium chloride  Physical Exam Vitals signs and nursing note reviewed.  Constitutional:      Interventions: He is sedated and intubated.  HENT:     Head: Normocephalic and atraumatic.  Cardiovascular:     Rate and Rhythm: Tachycardia present.  Pulmonary:     Effort: No tachypnea, accessory muscle usage or respiratory distress. He is intubated.  Abdominal:     General: Bowel sounds are normal.     Tenderness: There is no abdominal tenderness.  Skin:    Comments: Scattered blisters.             Vital Signs: BP (!) 96/57 (BP Location: Left Arm)   Pulse (!) 136   Temp 99 F (37.2 C) (Oral)   Resp 15   Ht '6\' 3"'  (1.905 m)   Wt 63.2 kg   SpO2 100%   BMI 17.42 kg/m  SpO2: SpO2: 100 % O2 Device: O2 Device: Ventilator O2 Flow Rate: O2 Flow Rate (L/min): 15 L/min  Intake/output summary:   Intake/Output Summary (Last 24 hours) at 03/18/2019 1442 Last data filed at 03/18/2019 0400 Gross per 24 hour  Intake 3374.68 ml  Output 150 ml  Net 3224.68 ml   LBM: Last BM Date: 03/17/19 Baseline Weight: Weight: 75.8 kg Most recent weight: Weight: 63.2 kg       Palliative Assessment/Data: PPS 30%   Flowsheet Rows     Most Recent Value  Intake Tab  Referral Department  Critical care  Unit at Time of Referral  ICU  Palliative Care Primary Diagnosis  Sepsis/Infectious Disease  Palliative Care Type  New Palliative care  Reason for referral  Clarify Goals of Care  Date first seen by Palliative Care  03/17/19  Clinical Assessment  Palliative Performance Scale Score  30%  Psychosocial & Spiritual Assessment  Palliative Care  Outcomes  Patient/Family meeting held?  Yes  Who was at the meeting?  patient  Palliative Care Outcomes  Clarified goals of  care, Provided psychosocial or spiritual support, ACP counseling assistance      Patient Active Problem List   Diagnosis Date Noted  . Severe sepsis with acute organ dysfunction ([place]) 03/17/2019  . C. difficile colitis 03/17/2019  . MRSA bacteremia 03/17/2019  . Palliative care by specialist   . Goals of care, counseling/discussion   . AKI (acute kidney injury) ([place])   . Sepsis ([place]) 03/01/2019  . Pressure injury of skin 03/25/2019    Palliative Care Assessment & Plan   Patient Profile: 63 y.o. male  with past medical history of ETOH use, smoker, hypertension, Marfan syndrome, rheumatoid arthritis admitted on 03/25/2019 with altered mental status and appearing severely malnourished and cachectic. Patient recently hospitalized at [facility] between 3/8-3/20/20 after fall found to have aortic dissection and significant dilatation of aortic root. Positive for cdiff. Evaluated by CT surgery with plans for elective aortic surgery, that has been postponed due to Covid 19. In ED, Extensive chronic wounds and blistering lesions on all extremities. ICU admission with severe sepsis with right mid opacity likely pneumonia (? Mass--needs CT chest), left elbow effusion, acute renal failure, malnourishment and failure to thrive. Found to have c.difficile colitis and MRSA bacteremia. ID consultation pending. Palliative medicine consultation for goals of care.   Assessment: Severe sepsis/shock Metabolic acidosis Acute hypoxic and hypercapnic respiratory failure Type B aortic dissection MRSA bacteremia Cdiff diarrhea AKI Hypoalbuminemia  Recommendations/Plan:  Prior to intubation, patient verbalized care team contact his neighbor, Dollene Cleveland, if he was unable to make decisions for himself.   Discussed GOC with Ysidro Evert and his daughter, Debe Coder including diagnoses, interventions, and guarded prognosis. Educated on medical recommendation for DNR with underlying frailty, critical condition, and  co-morbidities. Ysidro Evert is not able to make a decision about code status today. Requests time to think about this.  Continue FULL code/FULL scope treatment. Updated Dr. Mortimer Fries and RN of this conversation. Plan for TEE and CT chest per attending.  PMT will follow.    Code Status: FULL   Code Status Orders  (From admission, onward)         Start     Ordered   03/26/2019 1813  Full code  Continuous     03/21/2019 1812        Code Status History    This patient has a current code status but no historical code status.       Prognosis:  Poor prognosis  Discharge Planning:  To Be Determined  Care plan was discussed with RN, Dr Mortimer Fries, neighbors Ysidro Evert and [facility] [place])  Thank you for allowing the Palliative Medicine Team to assist in the care of this patient.   Time In: 1050- 1245 Time Out: 1914 7829 Total Time 40 Prolonged Time Billed   no      Greater than 50%  of this time was spent counseling and coordinating care related to the above assessment and plan.  Ihor Dow, FNP-C Palliative Medicine Team  Phone: [phone] Fax: [phone]  Please contact Palliative Medicine Team phone at [phone] for questions and concerns.

## 2019-03-19 ENCOUNTER — Inpatient Hospital Stay: Payer: Medicaid Other

## 2019-03-19 ENCOUNTER — Inpatient Hospital Stay (HOSPITAL_COMMUNITY)
Admit: 2019-03-19 | Discharge: 2019-03-19 | Disposition: A | Discharge status: Home / Self Care | Payer: Medicaid Other | Attending: Internal Medicine | Admitting: Internal Medicine

## 2019-03-19 DIAGNOSIS — I361 Nonrheumatic tricuspid (valve) insufficiency: Secondary | ICD-10-CM

## 2019-03-19 DIAGNOSIS — I351 Nonrheumatic aortic (valve) insufficiency: Secondary | ICD-10-CM

## 2019-03-19 LAB — CBC
HCT: 25.1 % — ABNORMAL LOW (ref 39.0–52.0)
Hemoglobin: 8.5 g/dL — ABNORMAL LOW (ref 13.0–17.0)
MCH: 30.7 pg (ref 26.0–34.0)
MCHC: 33.9 g/dL (ref 30.0–36.0)
MCV: 90.6 fL (ref 80.0–100.0)
Platelets: 77 10*3/uL — ABNORMAL LOW (ref 150–400)
RBC: 2.77 MIL/uL — ABNORMAL LOW (ref 4.22–5.81)
RDW: 14.8 % (ref 11.5–15.5)
WBC: 19.6 10*3/uL — ABNORMAL HIGH (ref 4.0–10.5)
nRBC: 0 % (ref 0.0–0.2)

## 2019-03-19 LAB — CULTURE, BLOOD (ROUTINE X 2)

## 2019-03-19 LAB — BASIC METABOLIC PANEL
Anion gap: 10 (ref 5–15)
BUN: 51 mg/dL — ABNORMAL HIGH (ref 8–23)
CO2: 24 mmol/L (ref 22–32)
Calcium: 7.3 mg/dL — ABNORMAL LOW (ref 8.9–10.3)
Chloride: 101 mmol/L (ref 98–111)
Creatinine, Ser: 2.32 mg/dL — ABNORMAL HIGH (ref 0.61–1.24)
GFR calc Af Amer: 33 mL/min — ABNORMAL LOW (ref >60)
GFR calc non Af Amer: 29 mL/min — ABNORMAL LOW (ref >60)
Glucose, Bld: 84 mg/dL (ref 70–99)
Potassium: 3.8 mmol/L (ref 3.5–5.1)
Sodium: 135 mmol/L (ref 135–145)

## 2019-03-19 LAB — GLUCOSE, CAPILLARY
Glucose-Capillary: 90 mg/dL (ref 70–99)
Glucose-Capillary: 76 mg/dL (ref 70–99)
Glucose-Capillary: 72 mg/dL (ref 70–99)
Glucose-Capillary: 116 mg/dL — ABNORMAL HIGH (ref 70–99)

## 2019-03-19 LAB — VANCOMYCIN, RANDOM: Vancomycin Rm: 16

## 2019-03-19 LAB — BLOOD GAS, ARTERIAL
Acid-Base Excess: 0.7 mmol/L (ref 0.0–2.0)
Bicarbonate: 24.3 mmol/L (ref 20.0–28.0)
FIO2: 0.3
MECHVT: 450 mL
O2 Saturation: 93.7 %
PEEP: 5 cmH2O
Patient temperature: 37
RATE: 15 resp/min
pCO2 arterial: 35 mmHg (ref 32.0–48.0)
pH, Arterial: 7.45 (ref 7.350–7.450)
pO2, Arterial: 66 mmHg — ABNORMAL LOW (ref 83.0–108.0)

## 2019-03-19 LAB — PROTIME-INR
INR: 1.1 (ref 0.8–1.2)
Prothrombin Time: 13.9 seconds (ref 11.4–15.2)

## 2019-03-19 MED ORDER — VANCOMYCIN HCL 1.5 G IV SOLR
1500.0000 mg | INTRAVENOUS | Status: DC
  Administered 2019-03-19 – 2019-03-21 (×2): 1500 mg via INTRAVENOUS
  Filled 2019-03-19 (×3): qty 1500

## 2019-03-19 MED ORDER — LACTATED RINGERS IV BOLUS
2000.0000 mL | Freq: Once | INTRAVENOUS | Status: AC
  Administered 2019-03-19: 12:00:00 2000 mL via INTRAVENOUS

## 2019-03-19 MED ORDER — VANCOMYCIN 50 MG/ML ORAL SOLUTION
125.0000 mg | Freq: Four times a day (QID) | ORAL | Status: DC
  Filled 2019-03-19 (×3): qty 2.5

## 2019-03-19 MED ORDER — PRO-STAT SUGAR FREE PO LIQD
30.0000 mL | Freq: Two times a day (BID) | ORAL | Status: DC
  Administered 2019-03-19 – 2019-03-22 (×8): 30 mL

## 2019-03-19 MED ORDER — FOLIC ACID 5 MG/ML IJ SOLN
1.0000 mg | Freq: Every day | INTRAMUSCULAR | Status: DC
  Administered 2019-03-19: 12:00:00 1 mg via INTRAVENOUS
  Filled 2019-03-19: qty 0.2

## 2019-03-19 MED ORDER — FOLIC ACID 1 MG PO TABS
1.0000 mg | ORAL_TABLET | Freq: Every day | ORAL | Status: DC
  Administered 2019-03-20 – 2019-03-22 (×3): 1 mg
  Filled 2019-03-19 (×3): qty 1

## 2019-03-19 MED ORDER — VANCOMYCIN 50 MG/ML ORAL SOLUTION
500.0000 mg | Freq: Four times a day (QID) | ORAL | Status: DC
  Administered 2019-03-19 – 2019-03-22 (×14): 500 mg via ORAL
  Filled 2019-03-19 (×16): qty 10

## 2019-03-19 MED ORDER — LACTATED RINGERS IV BOLUS
1000.0000 mL | Freq: Once | INTRAVENOUS | Status: AC
  Administered 2019-03-19: 1000 mL via INTRAVENOUS

## 2019-03-19 MED ORDER — NOREPINEPHRINE 16 MG/250ML-% IV SOLN
0.0000 ug/min | INTRAVENOUS | Status: DC
  Filled 2019-03-19: qty 250

## 2019-03-19 MED ORDER — THIAMINE HCL 100 MG/ML IJ SOLN
100.0000 mg | Freq: Every day | INTRAMUSCULAR | Status: DC
  Administered 2019-03-19: 100 mg via INTRAVENOUS
  Filled 2019-03-19: qty 2

## 2019-03-19 MED ORDER — VITAL 1.5 CAL PO LIQD
1000.0000 mL | ORAL | Status: DC
  Administered 2019-03-19 – 2019-03-22 (×3): 1000 mL

## 2019-03-19 MED ORDER — LIDOCAINE VISCOUS HCL 2 % MT SOLN
OROMUCOSAL | Status: AC
  Filled 2019-03-19: qty 15

## 2019-03-19 MED ORDER — ADULT MULTIVITAMIN LIQUID CH
15.0000 mL | Freq: Every day | ORAL | Status: DC
  Administered 2019-03-19 – 2019-03-22 (×4): 15 mL
  Filled 2019-03-19 (×5): qty 15

## 2019-03-19 MED ORDER — VITAMIN B-1 100 MG PO TABS
100.0000 mg | ORAL_TABLET | Freq: Every day | ORAL | Status: DC
  Administered 2019-03-20 – 2019-03-22 (×3): 100 mg
  Filled 2019-03-19 (×3): qty 1

## 2019-03-19 MED ORDER — BUTAMBEN-TETRACAINE-BENZOCAINE 2-2-14 % EX AERO
INHALATION_SPRAY | CUTANEOUS | Status: AC
  Filled 2019-03-19: qty 5

## 2019-03-19 MED ORDER — DEXTROSE IN LACTATED RINGERS 5 % IV SOLN
INTRAVENOUS | Status: DC
  Administered 2019-03-19: 12:00:00 via INTRAVENOUS

## 2019-03-19 NOTE — Progress Notes (Signed)
Pharmacy Antibiotic Note  Leonard Peters is a 63 y.o. male admitted on 04-13-2019 with multiple infections. Patient with recent history of drug abuse and presents with clostridium difficile, MRSA bacteremia, cellulitis, UTI, and possible pneumonia. Patient with left heart catheterization and clostridium difficile infection in March 2020. Pharmacy has been consulted for vancomycin dosing.  Patient now with possible ileus or SBO. Tee negative for endocarditis.  Plan:   Increase vancomycin to 1500 mg IV q48hr. If creatinine clearance/serum creatinine continues to improve, anticipate adjusting schedule. Hesitant to dose more frequently than q48h as very little urine output charted. Will reassess SCr with morning labs.  Expected AUC: 513.4 SCr: 2.32  Vancomycin 500mg  PR Q6hr s/t ileus/SBO. Metronidazole 500mg  IV Q8hr continued until patient able to consistently receive oral/via tube vancomycin.   Height: 6\' 3"  (190.5 cm) Weight: 148 lb 5.9 oz (67.3 kg) IBW/kg (Calculated) : 84.5  Temp (24hrs), Avg:97.7 F (36.5 C), Min:96.8 F (36 C), Max:98.9 F (37.2 C)  Recent Labs  Lab 04-13-19 1216 13-Apr-2019 1502 04-13-19 1938 April 13, 2019 2056 03/17/19 0538 03/17/19 0636 03/18/19 0422 03/19/19 0243  WBC 25.6*  --   --  24.0*  --  21.0* 21.6* 19.6*  CREATININE 4.60*  --  4.28*  --  3.56*  --  2.77* 2.32*  LATICACIDVEN >11.0* 5.7* 1.9  --   --   --   --   --   VANCORANDOM  --   --   --   --   --   --   --  16    Estimated Creatinine Clearance: 31 mL/min (A) (by C-G formula based on SCr of 2.32 mg/dL (H)).    Allergies  Allergen Reactions  . Lipitor [Atorvastatin Calcium]     Elevated LFTs    Antimicrobials this admission: Cefepime 5/17 x 1 Metronidazole 5/17, 5/18 >>  Vancomycin IV 5/17 >>  Vancomycin PO 5/18 >> 5/18, PR 5/18 >>  Ceftriaxone 5/18 x 1  Doxycycline 5/18 x 1   Dose adjustments this admission: 5/18 Vancomycin PO adjusted to 500mg  PO Q6hr.  5/19 Vancomycin PO adjusted to  PR  5/20 Vanc IV 1000 mg q48h >> IV 1500 mg q48h  Microbiology results: 5/17 BCx: 4/4 bottles MRSA  5/17 UCx: >100K MRSA  5/17 MRSA PCR: positive  5/17 COVID: negative  5/18 Clostridium Difficile: positive 5/19 BCx: sent   Thank you for allowing pharmacy to be a part of this patient's care.  Pricilla Riffle, PharmD Pharmacy Resident  03/19/2019 3:31 PM

## 2019-03-19 NOTE — Progress Notes (Signed)
Daily Progress Note   Patient Name: Leonard Peters       Date: 03/19/2019 DOB: 20-Oct-1956  Age: 63 y.o. MRN#: [id number] Attending Physician: Saundra Shelling, MD Primary Care Physician: System, Pcp Not In Admit Date: 03/03/2019  Reason for Consultation/Follow-up: Establishing goals of care  Subjective: Remains intubated/sedated on fentanyl gtt. Appears comfortable without s/s of distress. TEE done this afternoon, therefore wake up assessment was not attempted this AM.   GOC: F/u call placed to patient's neighbors. Discussed plan of care including diagnoses, interventions, and guarded/poor prognosis. Explained TEE results. Encouraged neighbors to contact nursing staff in they wish to see him via [place] video chat.    Length of Stay: 3  Current Medications: Scheduled Meds:  . butamben-tetracaine-benzocaine      . chlorhexidine gluconate (MEDLINE KIT)  15 mL Mouth Rinse BID  . Chlorhexidine Gluconate Cloth  6 each Topical Q0600  . FLUoxetine  40 mg Per Tube Daily  . folic acid  1 mg Intravenous Daily  . heparin injection (subcutaneous)  5,000 Units Subcutaneous Q8H  . lidocaine      . mouth rinse  15 mL Mouth Rinse 10 times per day  . mupirocin ointment  1 application Nasal BID  . nutrition supplement (JUVEN)  1 packet Per Tube BID BM  . thiamine injection  100 mg Intravenous Daily  . vancomycin (VANCOCIN) rectal ENEMA  500 mg Rectal Q6H    Continuous Infusions: . sodium chloride    . sodium chloride    . dextrose 5% lactated ringers 100 mL/hr at 03/19/19 1200  . fentaNYL infusion INTRAVENOUS 200 mcg/hr (03/19/19 1200)  . metronidazole Stopped (03/19/19 0914)  . vancomycin 250 mL/hr at 03/19/19 1200    PRN Meds: sodium chloride, acetaminophen **OR** acetaminophen, fentaNYL  (SUBLIMAZE) injection, hydrALAZINE, LORazepam, ondansetron **OR** ondansetron (ZOFRAN) IV, polyethylene glycol, sodium chloride  Physical Exam Vitals signs and nursing note reviewed.  Constitutional:      Interventions: He is sedated and intubated.  HENT:     Head: Normocephalic and atraumatic.  Cardiovascular:     Rate and Rhythm: Tachycardia present.  Pulmonary:     Effort: No tachypnea, accessory muscle usage or respiratory distress. He is intubated.  Abdominal:     General: Bowel sounds are normal.     Tenderness: There is no abdominal tenderness.  Skin:    Comments: Scattered blisters.             Vital Signs: BP (!) 75/61 (BP Location: Left Arm)   Pulse (!) 127   Temp 98.9 F (37.2 C) (Axillary)   Resp 12   Ht '6\' 3"'  (1.905 m)   Wt 67.3 kg   SpO2 99%   BMI 18.54 kg/m  SpO2: SpO2: 99 % O2 Device: O2 Device: Ventilator O2 Flow Rate: O2 Flow Rate (L/min): 0 L/min  Intake/output summary:   Intake/Output Summary (Last 24 hours) at 03/19/2019 1415 Last data filed at 03/19/2019 1200 Gross per 24 hour  Intake 7537.57 ml  Output 225 ml  Net 7312.57 ml   LBM: Last BM Date: 03/19/19 Baseline Weight: Weight: 75.8 kg Most recent weight: Weight: 67.3 kg       Palliative Assessment/Data: PPS 30%   Flowsheet Rows     Most Recent Value  Intake Tab  Referral Department  Critical care  Unit at Time of Referral  ICU  Palliative Care Primary Diagnosis  Sepsis/Infectious Disease  Palliative Care Type  New Palliative care  Reason for referral  Clarify Goals of Care  Date first seen by Palliative Care  03/17/19  Clinical Assessment  Palliative Performance Scale Score  30%  Psychosocial & Spiritual Assessment  Palliative Care Outcomes  Patient/Family meeting held?  Yes  Who was at the meeting?  patient  Palliative Care Outcomes  Clarified goals of care, Provided psychosocial or spiritual support, ACP counseling assistance      Patient Active Problem List   Diagnosis  Date Noted  . Acute respiratory failure ([place])   . Severe sepsis with acute organ dysfunction ([place]) 03/17/2019  . C. difficile colitis 03/17/2019  . MRSA bacteremia 03/17/2019  . Palliative care by specialist   . Goals of care, counseling/discussion   . AKI (acute kidney injury) ([place])   . Sepsis ([place]) 03/17/2019  . Pressure injury of skin 03/10/2019    Palliative Care Assessment & Plan   Patient Profile: 63 y.o. male  with past medical history of ETOH use, smoker, hypertension, Marfan syndrome, rheumatoid arthritis admitted on 03/05/2019 with altered mental status and appearing severely malnourished and cachectic. Patient recently hospitalized at [facility] between 3/8-3/20/20 after fall found to have aortic dissection and significant dilatation of aortic root. Positive for cdiff. Evaluated by CT surgery with plans for elective aortic surgery, that has been postponed due to Covid 19. In ED, Extensive chronic wounds and blistering lesions on all extremities. ICU admission with severe sepsis with right mid opacity likely pneumonia (? Mass--needs CT chest), left elbow effusion, acute renal failure, malnourishment and failure to thrive. Found to have c.difficile colitis and MRSA bacteremia. ID consultation pending. Palliative medicine consultation for goals of care.   Assessment: Severe sepsis/shock Metabolic acidosis Acute hypoxic and hypercapnic respiratory failure Type B aortic dissection MRSA bacteremia Cdiff diarrhea AKI Hypoalbuminemia  Recommendations/Plan:  Prior to intubation, patient verbalized care team contact his neighbor, Dollene Cleveland, if he was unable to make decisions for himself.   03/19/19 discussed GOC with Ysidro Evert and his daughter, Debe Coder including diagnoses, interventions, and guarded prognosis. Educated on medical recommendation for DNR with underlying frailty, critical condition, and co-morbidities. Ysidro Evert is not able to make a decision about code status. Requests time to  think about this.  Continue FULL code/FULL scope treatment. TEE completed. No vegetation. WBC's trending down. Repeat BC's no growth in 24 hours. ID following.  Poor support system. No next-of-kin. Neighbor has  only known patient for about 2 years and patient did not discuss EOL wishes with neighbor.  PMT will follow and continue to support patient/friends.   Code Status: FULL   Code Status Orders  (From admission, onward)         Start     Ordered   03/15/2019 1813  Full code  Continuous     03/10/2019 1812        Code Status History    This patient has a current code status but no historical code status.       Prognosis:  Poor prognosis  Discharge Planning:  To Be Determined  Care plan was discussed with RN, neighbor [facility])  Thank you for allowing the Palliative Medicine Team to assist in the care of this patient.   Time In: 1330- Time Out: 1400 Total Time 30 Prolonged Time Billed   no      Greater than 50%  of this time was spent counseling and coordinating care related to the above assessment and plan.  Ihor Dow, FNP-C Palliative Medicine Team  Phone: [phone] Fax: [phone]  Please contact Palliative Medicine Team phone at [phone] for questions and concerns.

## 2019-03-19 NOTE — Procedures (Signed)
Central Venous Catheter Insertion Procedure Note Pinches Zarrella [id number] 1956/09/20  Procedure: Insertion of Central Venous Catheter Indications: Assessment of intravascular volume, Drug and/or fluid administration and Frequent blood sampling  Procedure Details Consent: Unable to obtain consent because of emergent medical necessity. Time Out: Verified patient identification, verified procedure, site/side was marked, verified correct patient position, special equipment/implants available, medications/allergies/relevent history reviewed, required imaging and test results available.  Performed  Maximum sterile technique was used including antiseptics, cap, gloves, gown, hand hygiene, mask and sheet. Skin prep: Chlorhexidine; local anesthetic administered A antimicrobial bonded/coated triple lumen catheter was placed in the right internal jugular vein using the Seldinger technique.  Evaluation Blood flow good Complications: No apparent complications Patient did tolerate procedure well. Chest X-ray ordered to verify placement.  CXR: pending.   Procedure was performed using Ultrasound for direct visualization of cannulization of Right IJ.    Harlon Ditty, AGACNP-BC [facility] Pager: [phone] Cell: [phone]  Judithe Modest 03/19/2019, 11:54 PM

## 2019-03-19 NOTE — Progress Notes (Signed)
Pharmacy Electrolyte Monitoring Consult:  Pharmacy consulted to assist in monitoring and replacing electrolytes in this 63 y.o. male admitted on Mar 21, 2019 with Altered Mental Status   Labs:  Sodium (mmol/L)  Date Value  03/19/2019 135   Potassium (mmol/L)  Date Value  03/19/2019 3.8   Magnesium (mg/dL)  Date Value  91/47/8295 1.9   Phosphorus (mg/dL)  Date Value  62/13/0865 2.7   Calcium (mg/dL)  Date Value  78/46/9629 7.3 (L)   Albumin (g/dL)  Date Value  52/84/1324 1.7 (L)   Corrected calcium: 9  Assessment/Plan: No replacement warranted.  Will check all electrolytes with am labs.   Will replace for goal potassium ~ 4 and goal magnesium ~ 2.   Pharmacy will continue to monitor and adjust per consult.   Kavaughn Faucett L 03/19/2019 5:08 PM

## 2019-03-19 NOTE — Progress Notes (Signed)
[facility] [place] [facility]   PATIENT NAME: Leonard BitterRichard Peters    MR#:  [id number]  DATE OF BIRTH:  04/21/1956  SUBJECTIVE:  CHIEF COMPLAINT:   Chief Complaint  Patient presents with  . Altered Mental Status  Patient seen and evaluated today Intubated and on ventilator  REVIEW OF SYSTEMS:    ROS Unable to obtain as patient is critically ill DRUG ALLERGIES:   Allergies  Allergen Reactions  . Lipitor [Atorvastatin Calcium]     Elevated LFTs    VITALS:  Blood pressure (!) 80/43, pulse (!) 124, temperature 98.9 F (37.2 C), temperature source Axillary, resp. rate 16, height 6\' 3"  (1.905 m), weight 67.3 kg, SpO2 99 %.  PHYSICAL EXAMINATION:   Physical Exam  GENERAL:  63 y.o.-year-old patient lying in the bed on ventilator EYES: Pupils equal, round, reactive to light and accommodation. No scleral icterus. Extraocular muscles intact.  HEENT: Head atraumatic, normocephalic. Oropharynx and nasopharynx clear.  NECK:  Supple, no jugular venous distention. No thyroid enlargement, no tenderness.  LUNGS: Decreased breath sounds bilaterally, rales heard in both lungs Tidal volume 450 Rate 15 PEEP 5 FiO2 35 % CARDIOVASCULAR: S1, S2 normal. No murmurs, rubs, or gallops.  ABDOMEN: Soft, nontender, nondistended. Bowel sounds present. No organomegaly or mass.  EXTREMITIES: No cyanosis, clubbing or edema b/l.    NEUROLOGIC: On ventilator could not be assessed PSYCHIATRIC: The patient is alert and oriented none SKIN: No obvious rash, lesion, or ulcer.   LABORATORY PANEL:   CBC Recent Labs  Lab 03/19/19 0243  WBC 19.6*  HGB 8.5*  HCT 25.1*  PLT 77*   ------------------------------------------------------------------------------------------------------------------ Chemistries  Recent Labs  Lab 03/02/2019 1216  03/18/19 0422 03/19/19 0243  NA 131*   < > 136 135  K 2.1*   < > 3.7 3.8  CL 92*   < > 106 101  CO2 11*   < > 16* 24  GLUCOSE 137*   < > 80 84   BUN 58*   < > 55* 51*  CREATININE 4.60*   < > 2.77* 2.32*  CALCIUM 7.6*   < > 7.4* 7.3*  MG  --    < > 1.9  --   AST 57*  --   --   --   ALT 18  --   --   --   ALKPHOS 123  --   --   --   BILITOT 0.7  --   --   --    < > = values in this interval not displayed.   ------------------------------------------------------------------------------------------------------------------  Cardiac Enzymes No results for input(s): TROPONINI in the last 168 hours. ------------------------------------------------------------------------------------------------------------------  RADIOLOGY:  Dg Abd 1 View  Result Date: 03/18/2019 CLINICAL DATA:  Nasogastric tube placement. EXAM: ABDOMEN - 1 VIEW COMPARISON:  Radiographs Mar 17, 2019. FINDINGS: Mildly dilated small bowel loops are noted in the left side of the abdomen which may represent ileus or possibly distal small bowel obstruction. Distal tip of nasogastric tube is seen in expected position of proximal stomach. Side hole appears to be just above gastroesophageal junction. IMPRESSION: Distal tip of nasogastric tube appears to be in proximal stomach with side hole just above gastroesophageal junction; advancement is recommended. Mildly dilated small bowel loops are noted concerning for ileus or possibly distal small bowel obstruction. Electronically Signed   By: Lupita RaiderJames  Green Jr M.D.   On: 03/18/2019 08:10   [place] Renal  Result Date: 03/17/2019 CLINICAL DATA:  Acute kidney injury EXAM:  RENAL / URINARY TRACT ULTRASOUND COMPLETE COMPARISON:  None. FINDINGS: Right Kidney: Renal measurements: 10.6 x 4.7 x 4.4 cm = volume: 114 mL . Echogenicity within normal limits. No mass or hydronephrosis visualized. 15 mm right upper pole cyst. Left Kidney: Renal measurements: 9.9 x 5.3 by 4.7 cm = volume: 127 mL. Echogenicity within normal limits. No mass or hydronephrosis visualized. Bladder: Diffuse bladder wall thickening, mild. Ascites around the liver IMPRESSION: Negative  for renal mass or obstruction.  Bladder wall thickening Ascites Electronically Signed   By: Marlan Palauharles  Clark M.D.   On: 03/17/2019 13:44   Mr Elbow Right Wo Contrast  Result Date: 03/17/2019 CLINICAL DATA:  Wound overlying the olecranon process. Evaluate for osteomyelitis. EXAM: MRI OF THE RIGHT ELBOW WITHOUT CONTRAST TECHNIQUE: Multiplanar, multisequence MR imaging of the elbow was performed. No intravenous contrast was administered. COMPARISON:  None. FINDINGS: TENDONS Common forearm flexor origin: Intact. Common forearm extensor origin: Mild tendinosis of the common extensor tendon origin. Biceps: Intact. Triceps: Intact. LIGAMENTS Medial stabilizers: Intact. Lateral stabilizers:  Intact. Cartilage: Small focal area of cartilage loss involving the ulnar aspect of the radial head with subchondral reactive marrow changes. Joint: Large elbow joint effusion. Cubital tunnel: Normal. Bones: Large soft tissue wound overlying the olecranon process extending to the cortex. Single focal area of cortical irregularity and subcortical marrow edema involving the posterior olecranon on (image 14/series 7) concerning for an area of reactive marrow edema versus mild early osteomyelitis. 9.2 x 15 mm fluid collection along the posterior superficial aspect of the triceps tendon which extends through the triceps tendon into the posterior fat pad with edema throughout the posterior fat pad. IMPRESSION: 1. Large soft tissue wound overlying the olecranon process extending to the cortex. Single focal area of cortical irregularity and subcortical marrow edema involving the posterior olecranon on (image 14/series 7) concerning for an area of reactive marrow edema versus mild early osteomyelitis. 9.2 x 15 mm fluid collection along the posterior superficial aspect of the triceps tendon which extends through the triceps tendon into the posterior fat pad with edema throughout the posterior fat pad concerning for an abscess. Large elbow joint  effusion which may be reactive, but is most concerning for septic arthritis. Electronically Signed   By: Elige KoHetal  Patel   On: 03/17/2019 13:50   Dg Chest Port 1 View  Result Date: 03/19/2019 CLINICAL DATA:  63 y/o  M; acute respiratory failure. EXAM: PORTABLE CHEST 1 VIEW COMPARISON:  03/18/2019 chest radiograph. FINDINGS: Stable cardiac silhouette and uncoiled aorta given oblique technique. Endotracheal tube tip projects 6.5 cm above the carina. Enteric tube tip extends below the field of view into the abdomen. Increasing basilar infiltrates greater on the left. Possible small effusions. No pneumothorax. Cervical fusion hardware, partially visualized. No acute osseous abnormality is evident. IMPRESSION: Stable support apparatus. Increasing basilar infiltrates greater on the left. Possible small effusions. Electronically Signed   By: Mitzi HansenLance  Furusawa-Stratton M.D.   On: 03/19/2019 03:58   Dg Chest Port 1 View  Result Date: 03/18/2019 CLINICAL DATA:  Status post intubation. EXAM: PORTABLE CHEST 1 VIEW COMPARISON:  Radiograph of Mar 16, 2019. FINDINGS: Stable cardiomediastinal silhouette. Endotracheal and nasogastric tubes are in grossly good position. No pneumothorax is noted. Left lung is clear. Minimal right basilar subsegmental atelectasis or infiltrate is noted. Bony thorax is unremarkable. IMPRESSION: Endotracheal and nasogastric tubes are in grossly good position. Minimal right basilar subsegmental atelectasis or infiltrate. Electronically Signed   By: Lupita RaiderJames  Green Jr M.D.   On: 03/18/2019  08:09     ASSESSMENT AND PLAN:  63 year old male patient currently in the ICU for sepsis  -Acute hypoxic respiratory failure Continue mechanical ventilation Continue vent bundle PEEP as tolerated Intensivist follow-up  -Sepsis severe with hypovolemia and shock IV fluids Broad-spectrum antibiotics Follow-up cultures IV pressors as needed  -MRSA bacteremia Continue broad-spectrum antibiotics Will need  TEE, cardiology follow-up COVID test negative Infectious disease follow-up appreciated  -Type B aortic dissection Further plan of management as per intensivist and infectious disease attending Patient had a plan to do surgery at [facility] but no follow-up done  -Right lung pneumonia with opacity Continue IV antibiotics CT chest down the line  -Acute kidney injury Avoid nephrotoxic drugs IV fluids and monitor renal function  -Right elbow cellulitis Follow-up MRI to check for any osteomyelitis  -Adult failure to thrive Nutritional supplements  -C. difficile colitis Contact and enteric precautions Continue Flagyl and vancomycin Switch to oral meds once tolerated  -Hypotension secondary to sepsis IV fluids and IV pressors as needed  -Acute systolic heart failure Lasix as needed Cardiology recommendations  -Right elbow osteomyelitis Status post orthopedic evaluation Wound VAC recommended  -Palliative care on board   All the records are reviewed and case discussed with Care Management/Social Worker. Management plans discussed with the patient, family and they are in agreement.  CODE STATUS: Full code  DVT Prophylaxis: SCDs  TOTAL TIME TAKING CARE OF THIS PATIENT: 38 minutes.   POSSIBLE D/C IN 2 to 3 DAYS, DEPENDING ON CLINICAL CONDITION.  Ihor Austin M.D on 03/19/2019 at 10:14 AM  Between 7am to 6pm - Pager - [phone]  After 6pm go to [email or web address] - password [email or web address]  [facility] [place] [facility]  Office  [phone]  CC: Primary care physician; System, Pcp Not In  Note: This dictation was prepared with Dragon dictation along with smaller phrase technology. Any transcriptional errors that result from this process are unintentional.

## 2019-03-19 NOTE — Progress Notes (Signed)
CRITICAL CARE NOTE  CC  follow up respiratory failure  SUBJECTIVE Patient remains critically ill Prognosis is guarded Full vent support +DT's Needs TEE r/o ENDO CARDITIS Shock-responding to IVF's   BP 110/89   Pulse (!) 119   Temp (!) 97.1 F (36.2 C) (Oral)   Resp 14   Ht '6\' 3"'  (1.905 m)   Wt 67.3 kg   SpO2 100%   BMI 18.54 kg/m    I/O last 3 completed shifts: In: 8469 [P.O.:480; I.V.:3378.5; Other:30; IV Piggyback:4580.5] Out: 275 [Urine:275] No intake/output data recorded.  SpO2: 100 % O2 Flow Rate (L/min): 0 L/min FiO2 (%): (S) 40 %   SIGNIFICANT EVENTS 5/17 admitted for sepsis 5/18 C diff POS, MRSA +bacteremia, MRI c/w RT elbow OSTEO 5/19 DT's and Severe resp failure, ETT 7.5, aspiration pneumonia 5/20 plan for TEE  REVIEW OF SYSTEMS  PATIENT IS UNABLE TO PROVIDE COMPLETE REVIEW OF SYSTEMS DUE TO SEVERE CRITICAL ILLNESS   PHYSICAL EXAMINATION:  GENERAL:critically ill appearing, +resp distress HEAD: Normocephalic, atraumatic.  EYES: Pupils equal, round, reactive to light.  No scleral icterus.  MOUTH: Moist mucosal membrane. NECK: Supple. No thyromegaly. No nodules. No JVD.  PULMONARY: +rhonchi, +wheezing CARDIOVASCULAR: S1 and S2. Regular rate and rhythm. No murmurs, rubs, or gallops.  GASTROINTESTINAL: Soft, nontender, -distended. No masses. Positive bowel sounds. No hepatosplenomegaly.  MUSCULOSKELETAL: No swelling, clubbing, or edema.  NEUROLOGIC: obtunded, GCS<8 SKIN:intact,warm,dry  MEDICATIONS: I have reviewed all medications and confirmed regimen as documented   CULTURE RESULTS   Recent Results (from the past 240 hour(s))  Blood Culture (routine x 2)     Status: Abnormal (Preliminary result)   Collection Time: 03/20/2019 12:16 PM  Result Value Ref Range Status   Specimen Description   Final    BLOOD BLOOD RIGHT FOREARM Performed at [facility], [address] [place], [place] [address]    Special Requests   Final     BOTTLES DRAWN AEROBIC AND ANAEROBIC Blood Culture results may not be optimal due to an excessive volume of blood received in culture bottles Performed at [facility], [address], [place], [place] [address]    Culture  Setup Time   Final    GRAM POSITIVE COCCI IN BOTH AEROBIC AND ANAEROBIC BOTTLES CRITICAL RESULT CALLED TO, READ BACK BY AND VERIFIED WITH: JASON ROBBINS ON 03/17/2019 AT 0012 QSD Performed at [facility], [address], [place], [place] [address]    Culture STAPHYLOCOCCUS AUREUS (A)  Final   Report Status PENDING  Incomplete  Blood Culture (routine x 2)     Status: None (Preliminary result)   Collection Time: 03/15/2019 12:16 PM  Result Value Ref Range Status   Specimen Description   Final    BLOOD LAC Performed at [facility], [address], [place], [place] [address]    Special Requests   Final    BOTTLES DRAWN AEROBIC AND ANAEROBIC Blood Culture results may not be optimal due to an excessive volume of blood received in culture bottles Performed at [facility], [address] [place], [place] [address]    Culture  Setup Time   Final    GRAM POSITIVE COCCI IN BOTH AEROBIC AND ANAEROBIC BOTTLES CRITICAL RESULT CALLED TO, READ BACK BY AND VERIFIED WITH: JASON ROBBINS ON 03/17/2019 AT 0012 QSD Performed at [facility], [place] [address], [place], [place] [address]    Culture [facility] POSITIVE COCCI  Final   Report Status PENDING  Incomplete  Urine culture     Status: Abnormal   Collection Time: 03/21/2019 12:16 PM  Result Value Ref Range Status   Specimen Description   Final    URINE, RANDOM Performed at [facility], [address] [place], [place] [address]    Special Requests   Final    NONE Performed at [facility], [place], [place] [address]    Culture (A)  Final    >=100,000 COLONIES/mL METHICILLIN RESISTANT STAPHYLOCOCCUS AUREUS   Report Status 03/18/2019 FINAL  Final    Organism ID, Bacteria METHICILLIN RESISTANT STAPHYLOCOCCUS AUREUS (A)  Final      Susceptibility   Methicillin resistant staphylococcus aureus - MIC*    CIPROFLOXACIN <=0.5 SENSITIVE Sensitive     GENTAMICIN <=0.5 SENSITIVE Sensitive     NITROFURANTOIN 32 SENSITIVE Sensitive     OXACILLIN >=4 RESISTANT Resistant     TETRACYCLINE <=1 SENSITIVE Sensitive     VANCOMYCIN 1 SENSITIVE Sensitive     TRIMETH/SULFA <=10 SENSITIVE Sensitive     CLINDAMYCIN <=0.25 SENSITIVE Sensitive     RIFAMPIN <=0.5 SENSITIVE Sensitive     Inducible Clindamycin NEGATIVE Sensitive     * >=100,000 COLONIES/mL METHICILLIN RESISTANT STAPHYLOCOCCUS AUREUS  SARS Coronavirus 2 (CEPHEID- Performed in [place] [facility] lab), Hosp Order     Status: None   Collection Time: 03/27/2019 12:16 PM  Result Value Ref Range Status   SARS Coronavirus 2 NEGATIVE NEGATIVE Final    Comment: (NOTE) If result is NEGATIVE SARS-CoV-2 target nucleic acids are NOT DETECTED. The SARS-CoV-2 RNA is generally detectable in upper and lower  respiratory specimens during the acute phase of infection. The lowest  concentration of SARS-CoV-2 viral copies this assay can detect is 250  copies / mL. A negative result does not preclude SARS-CoV-2 infection  and should not be used as the sole basis for treatment or other  patient management decisions.  A negative result may occur with  improper specimen collection / handling, submission of specimen other  than nasopharyngeal swab, presence of viral mutation(s) within the  areas targeted by this assay, and inadequate number of viral copies  (<250 copies / mL). A negative result must be combined with clinical  observations, patient history, and epidemiological information. If result is POSITIVE SARS-CoV-2 target nucleic acids are DETECTED. The SARS-CoV-2 RNA is generally detectable in upper and lower  respiratory specimens dur ing the acute phase of infection.  Positive  results are indicative of  active infection with SARS-CoV-2.  Clinical  correlation with patient history and other diagnostic information is  necessary to determine patient infection status.  Positive results do  not rule out bacterial infection or co-infection with other viruses. If result is PRESUMPTIVE POSTIVE SARS-CoV-2 nucleic acids MAY BE PRESENT.   A presumptive positive result was obtained on the submitted specimen  and confirmed on repeat testing.  While 2019 novel coronavirus  (SARS-CoV-2) nucleic acids may be present in the submitted sample  additional confirmatory testing may be necessary for epidemiological  and / or clinical management purposes  to differentiate between  SARS-CoV-2 and other Sarbecovirus currently known to infect humans.  If clinically indicated additional testing with an alternate test  methodology [phone]) is advised. The SARS-CoV-2 RNA is generally  detectable in upper and lower respiratory sp ecimens during the acute  phase of infection. The expected result is Negative. Fact Sheet for Patients:  StrictlyIdeas.no Fact Sheet for Healthcare Providers: BankingDealers.co.za This test is not yet approved or cleared  by the [place] and has been authorized for detection and/or diagnosis of SARS-CoV-2 by FDA under an Emergency Use Authorization (EUA).  This EUA will remain in effect (meaning this test can be used) for the duration of the COVID-19 declaration under Section 564(b)(1) of the Act, 21 U.S.C. section 360bbb-3(b)(1), unless the authorization is terminated or revoked sooner. Performed at [facility], [address] [place], [place] [address]   Blood Culture ID Panel (Reflexed)     Status: Abnormal   Collection Time: 03/15/2019 12:16 PM  Result Value Ref Range Status   Enterococcus species NOT DETECTED NOT DETECTED Final   Listeria monocytogenes NOT DETECTED NOT DETECTED Final   Staphylococcus species  DETECTED (A) NOT DETECTED Final    Comment: CRITICAL RESULT CALLED TO, READ BACK BY AND VERIFIED WITH: JASON ROBBINS ON 03/17/2019 AT 0012 QSD    Staphylococcus aureus (BCID) DETECTED (A) NOT DETECTED Final    Comment: Methicillin (oxacillin)-resistant Staphylococcus aureus (MRSA). MRSA is predictably resistant to beta-lactam antibiotics (except ceftaroline). Preferred therapy is vancomycin unless clinically contraindicated. Patient requires contact precautions if  hospitalized. CRITICAL RESULT CALLED TO, READ BACK BY AND VERIFIED WITH: JASON ROBBINS ON 03/17/2019 AT 0012 QSD    Methicillin resistance DETECTED (A) NOT DETECTED Final    Comment: CRITICAL RESULT CALLED TO, READ BACK BY AND VERIFIED WITH: JASON ROBBINS ON 03/17/2019 AT 0012 QSD    Streptococcus species NOT DETECTED NOT DETECTED Final   Streptococcus agalactiae NOT DETECTED NOT DETECTED Final   Streptococcus pneumoniae NOT DETECTED NOT DETECTED Final   Streptococcus pyogenes NOT DETECTED NOT DETECTED Final   Acinetobacter baumannii NOT DETECTED NOT DETECTED Final   Enterobacteriaceae species NOT DETECTED NOT DETECTED Final   Enterobacter cloacae complex NOT DETECTED NOT DETECTED Final   Escherichia coli NOT DETECTED NOT DETECTED Final   Klebsiella oxytoca NOT DETECTED NOT DETECTED Final   Klebsiella pneumoniae NOT DETECTED NOT DETECTED Final   Proteus species NOT DETECTED NOT DETECTED Final   Serratia marcescens NOT DETECTED NOT DETECTED Final   Haemophilus influenzae NOT DETECTED NOT DETECTED Final   Neisseria meningitidis NOT DETECTED NOT DETECTED Final   Pseudomonas aeruginosa NOT DETECTED NOT DETECTED Final   Candida albicans NOT DETECTED NOT DETECTED Final   Candida glabrata NOT DETECTED NOT DETECTED Final   Candida krusei NOT DETECTED NOT DETECTED Final   Candida parapsilosis NOT DETECTED NOT DETECTED Final   Candida tropicalis NOT DETECTED NOT DETECTED Final    Comment: Performed at [facility], [address] [place], [place] [address]  MRSA PCR Screening     Status: Abnormal   Collection Time: 03/15/2019  6:16 PM  Result Value Ref Range Status   MRSA by PCR POSITIVE (A) NEGATIVE Final    Comment:        The GeneXpert MRSA Assay (FDA approved for NASAL specimens only), is one component of a comprehensive MRSA colonization surveillance program. It is not intended to diagnose MRSA infection nor to guide or monitor treatment for MRSA infections. RESULT CALLED TO, READ BACK BY AND VERIFIED WITH: PREDOME,F AT 2001 ON 03/29/2019 BY MOSLEY,J Performed at [facility], [address] [place], [place] [address]   C difficile quick scan w PCR reflex     Status: Abnormal   Collection Time: 03/17/19  4:45 AM  Result Value Ref Range Status   C Diff antigen POSITIVE (A) NEGATIVE Final   C Diff toxin POSITIVE (A) NEGATIVE Corrected    Comment: CRITICAL RESULT  CALLED TO, READ BACK BY AND VERIFIED WITH: FELICIA PREUDHOMME '@0545'  03/17/19 [facility] CORRECTED ON 05/19 AT 0853: PREVIOUSLY REPORTED AS POSITIVE RESULT CALLED TO, READ BACK BY AND VERIFIED WITH: FELICIA PREUDHOMME '@0545'  03/17/19 [facility]    C Diff interpretation Toxin producing C. difficile detected.  Final    Comment: Performed at [facility], [address] [place], [place] [address]  CULTURE, BLOOD (ROUTINE X 2) w Reflex to ID Panel     Status: None (Preliminary result)   Collection Time: 03/18/19 10:38 AM  Result Value Ref Range Status   Specimen Description BLOOD RIGHT HAND  Final   Special Requests   Final    BOTTLES DRAWN AEROBIC AND ANAEROBIC Blood Culture adequate volume   Culture   Final    NO GROWTH < 24 HOURS Performed at [facility], [address] [place], [place] [address]    Report Status PENDING  Incomplete  CULTURE, BLOOD (ROUTINE X 2) w Reflex to ID Panel     Status: None (Preliminary result)   Collection Time: 03/18/19 10:38 AM  Result Value Ref Range Status   Specimen Description  BLOOD RIGHT WRIST  Final   Special Requests   Final    BOTTLES DRAWN AEROBIC AND ANAEROBIC Blood Culture adequate volume   Culture   Final    NO GROWTH < 24 HOURS Performed at [facility], [place], [place] [address]    Report Status PENDING  Incomplete        CBC    Component Value Date/Time   WBC 19.6 (H) 03/19/2019 0243   RBC 2.77 (L) 03/19/2019 0243   HGB 8.5 (L) 03/19/2019 0243   HCT 25.1 (L) 03/19/2019 0243   PLT 77 (L) 03/19/2019 0243   MCV 90.6 03/19/2019 0243   MCH 30.7 03/19/2019 0243   MCHC 33.9 03/19/2019 0243   RDW 14.8 03/19/2019 0243   LYMPHSABS 1.6 03/18/2019 0422   MONOABS 0.3 03/18/2019 0422   EOSABS 0.0 03/18/2019 0422   BASOSABS 0.1 03/18/2019 0422   BMP Latest Ref Rng & Units 03/19/2019 03/18/2019 03/17/2019  Glucose 70 - 99 mg/dL 84 80 -  BUN 8 - 23 mg/dL 51(H) 55(H) -  Creatinine 0.61 - 1.24 mg/dL 2.32(H) 2.77(H) -  Sodium 135 - 145 mmol/L 135 136 -  Potassium 3.5 - 5.1 mmol/L 3.8 3.7 2.8(L)  Chloride 98 - 111 mmol/L 101 106 -  CO2 22 - 32 mmol/L 24 16(L) -  Calcium 8.9 - 10.3 mg/dL 7.3(L) 7.4(L) -      IMAGING    Dg Chest Port 1 View  Result Date: 03/19/2019 CLINICAL DATA:  63 y/o  M; acute respiratory failure. EXAM: PORTABLE CHEST 1 VIEW COMPARISON:  03/18/2019 chest radiograph. FINDINGS: Stable cardiac silhouette and uncoiled aorta given oblique technique. Endotracheal tube tip projects 6.5 cm above the carina. Enteric tube tip extends below the field of view into the abdomen. Increasing basilar infiltrates greater on the left. Possible small effusions. No pneumothorax. Cervical fusion hardware, partially visualized. No acute osseous abnormality is evident. IMPRESSION: Stable support apparatus. Increasing basilar infiltrates greater on the left. Possible small effusions. Electronically Signed   By: Kristine Garbe M.D.   On: 03/19/2019 03:58       Indwelling Urinary Catheter continued, requirement due to    Reason to continue Indwelling Urinary Catheter strict Intake/Output monitoring for hemodynamic instability         Ventilator continued, requirement due to severe respiratory failure  Ventilator Sedation RASS 0 to -2      ASSESSMENT AND PLAN SYNOPSIS  63 yo WM with acute metabolic encephalopathy from severe metabolic acidosis and severe sepsis with RT mid opacity likely pneumonia(?mass) with left elbow effusion and multiple skin lesions complicated by acute renal failure associated with malnourishment and FTT. Now complicated by acute resp failure requiring intubation with DT's and aspiration pneumonia      Severe ACUTE Hypoxic and Hypercapnic Respiratory Failure -continue Full MV support -continue Bronchodilator Therapy -Wean Fio2 and PEEP as tolerated -will perform SAT/SBT when respiratory parameters are met   ACUTE KIDNEY INJURY/Renal Failure -follow chem 7 -follow UO -continue Foley Catheter-assess need -Avoid nephrotoxic agents -Recheck creatinine  -Renal ultrasound   NEUROLOGY - intubated and sedated - minimal sedation to achieve a RASS goal: -1 DT's   SHOCK-SEPSIS/HYPOVOLUMIC/CARDIOGENIC -use vasopressors to keep MAP>65 if needed -follow ABG and LA -follow up cultures -emperic ABX -consider stress dose steroids -aggressive IV fluid resuscitation  CARDIAC ICU monitoring  ID continue IV abx as prescibed -follow up cultures MRSA bacteremia-plan for TEE today C diff colitis meds adjusted based on access COVID-19 NEGATIVE:Acute COVID-19 infection ruled out by PCR.  HIV NEG Repeat blood cultures pending MRI suggests Septic Arthritis and Osteomyelitis-follow up  ORTHO CONSULT  GI GI PROPHYLAXIS as indicated +abd ilieus Follow up ABD xray  NUTRITIONAL STATUS DIET-->NPO Constipation protocol as indicated  ENDO - will use ICU hypoglycemic\Hyperglycemia protocol if indicated   ELECTROLYTES -follow labs as needed -replace as  needed -pharmacy consultation and following   DVT/GI PRX ordered TRANSFUSIONS AS NEEDED MONITOR FSBS ASSESS the need for LABS as needed   Critical Care Time devoted to patient care services described in this note is 45 minutes.   Overall, patient is critically ill, prognosis is guarded.  Patient with Multiorgan failure and at high risk for cardiac arrest and death.    Corrin Parker, M.D.  [facility]  Medical Director [place] Director [facility] Cardio-Pulmonary Department

## 2019-03-19 NOTE — Progress Notes (Signed)
*  PRELIMINARY RESULTS* Echocardiogram Echocardiogram Transesophageal has been performed.  Cristela Blue 03/19/2019, 11:25 AM

## 2019-03-19 NOTE — Progress Notes (Signed)
Transesophageal Echocardiogram :  Indication: MSRA bacteremia, r/o endocarditis Requesting/ordering  physician: DFr. Kasa  Procedure: Benzocaine spray x2 and 2 mls x 2 of viscous lidocaine were given orally to provide local anesthesia to the oropharynx. The patient was positioned supine on the left side, bite block provided. The patient was moderately sedated with the doses of versed and fentanyl as detailed below.  Using digital technique an omniplane probe was advanced into the distal esophagus without incident.   Moderate sedation:  Sedation used:    Fentanyl: turned up to 400 ug infusion  See report in EPIC  for complete details: In brief, transgastric imaging revealed normal LV function with no RWMAs and no mural apical thrombus.    Estimated ejection fraction was 55%.  Right sided cardiac chambers were normal with no evidence of pulmonary hypertension.  No valve endocarditis noted, mild to moderate AI noted, Trace MR, Mild TR, mild thickening of the aortic valve leaflets  Imaging of the septum showed no ASD or VSD, challenging images Bubble study not performed 2D and color flow confirmed no PFO  The LA was well visualized in orthogonal views.  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 Dissection resolves in the proximal descending aorta.   Julien Nordmann 03/19/2019 11:10 AM

## 2019-03-20 ENCOUNTER — Inpatient Hospital Stay: Payer: Medicaid Other

## 2019-03-20 DIAGNOSIS — Z452 Encounter for adjustment and management of vascular access device: Secondary | ICD-10-CM

## 2019-03-20 DIAGNOSIS — H9393 Unspecified disorder of ear, bilateral: Secondary | ICD-10-CM

## 2019-03-20 LAB — BASIC METABOLIC PANEL
Anion gap: 7 (ref 5–15)
BUN: 50 mg/dL — ABNORMAL HIGH (ref 8–23)
CO2: 26 mmol/L (ref 22–32)
Calcium: 7.7 mg/dL — ABNORMAL LOW (ref 8.9–10.3)
Chloride: 105 mmol/L (ref 98–111)
Creatinine, Ser: 2.36 mg/dL — ABNORMAL HIGH (ref 0.61–1.24)
GFR calc Af Amer: 33 mL/min — ABNORMAL LOW (ref >60)
GFR calc non Af Amer: 28 mL/min — ABNORMAL LOW (ref >60)
Glucose, Bld: 210 mg/dL — ABNORMAL HIGH (ref 70–99)
Potassium: 4 mmol/L (ref 3.5–5.1)
Sodium: 138 mmol/L (ref 135–145)

## 2019-03-20 LAB — CBC
HCT: 23.2 % — ABNORMAL LOW (ref 39.0–52.0)
Hemoglobin: 7.6 g/dL — ABNORMAL LOW (ref 13.0–17.0)
MCH: 30.9 pg (ref 26.0–34.0)
MCHC: 32.8 g/dL (ref 30.0–36.0)
MCV: 94.3 fL (ref 80.0–100.0)
Platelets: 67 10*3/uL — ABNORMAL LOW (ref 150–400)
RBC: 2.46 MIL/uL — ABNORMAL LOW (ref 4.22–5.81)
RDW: 15.5 % (ref 11.5–15.5)
WBC: 19.2 10*3/uL — ABNORMAL HIGH (ref 4.0–10.5)
nRBC: 0 % (ref 0.0–0.2)

## 2019-03-20 LAB — GLUCOSE, CAPILLARY
Glucose-Capillary: 203 mg/dL — ABNORMAL HIGH (ref 70–99)
Glucose-Capillary: 157 mg/dL — ABNORMAL HIGH (ref 70–99)
Glucose-Capillary: 129 mg/dL — ABNORMAL HIGH (ref 70–99)
Glucose-Capillary: 104 mg/dL — ABNORMAL HIGH (ref 70–99)
Glucose-Capillary: 82 mg/dL (ref 70–99)

## 2019-03-20 LAB — MAGNESIUM: Magnesium: 1.8 mg/dL (ref 1.7–2.4)

## 2019-03-20 LAB — PHOSPHORUS: Phosphorus: 4.2 mg/dL (ref 2.5–4.6)

## 2019-03-20 MED ORDER — PANTOPRAZOLE SODIUM 40 MG IV SOLR
40.0000 mg | INTRAVENOUS | Status: DC
  Administered 2019-03-21: 40 mg via INTRAVENOUS
  Filled 2019-03-20: qty 40

## 2019-03-20 MED ORDER — FENTANYL CITRATE (PF) 100 MCG/2ML IJ SOLN: 50.0000 ug | INTRAMUSCULAR | Status: DC | PRN

## 2019-03-20 MED ORDER — MAGNESIUM SULFATE 2 GM/50ML IV SOLN
2.0000 g | Freq: Once | INTRAVENOUS | Status: AC
  Administered 2019-03-20: 2 g via INTRAVENOUS
  Filled 2019-03-20: qty 50

## 2019-03-20 MED ORDER — LACTATED RINGERS IV BOLUS
500.0000 mL | Freq: Once | INTRAVENOUS | Status: AC
  Administered 2019-03-20: 500 mL via INTRAVENOUS

## 2019-03-20 MED ORDER — NOREPINEPHRINE BITARTRATE 1 MG/ML IV SOLN
0.0000 ug/min | INTRAVENOUS | Status: DC
  Administered 2019-03-20: 01:00:00 2 ug/min via INTRAVENOUS
  Administered 2019-03-21: 28 ug/min via INTRAVENOUS
  Administered 2019-03-22: 7 ug/min via INTRAVENOUS
  Filled 2019-03-20 (×3): qty 16

## 2019-03-20 MED ORDER — FENTANYL CITRATE (PF) 100 MCG/2ML IJ SOLN
50.0000 ug | INTRAMUSCULAR | Status: DC | PRN
  Administered 2019-03-20: 100 ug via INTRAVENOUS
  Administered 2019-03-20: 06:00:00 50 ug via INTRAVENOUS
  Administered 2019-03-21 – 2019-03-22 (×3): 100 ug via INTRAVENOUS

## 2019-03-20 MED ORDER — INSULIN ASPART 100 UNIT/ML ~~LOC~~ SOLN
0.0000 [IU] | SUBCUTANEOUS | Status: DC
  Administered 2019-03-20: 3 [IU] via SUBCUTANEOUS
  Administered 2019-03-20: 12:00:00 2 [IU] via SUBCUTANEOUS
  Administered 2019-03-21 (×3): 3 [IU] via SUBCUTANEOUS
  Administered 2019-03-21 – 2019-03-22 (×2): 2 [IU] via SUBCUTANEOUS
  Filled 2019-03-20 (×7): qty 1

## 2019-03-20 MED ORDER — LACTATED RINGERS IV SOLN
INTRAVENOUS | Status: DC
  Administered 2019-03-21 – 2019-03-22 (×6): via INTRAVENOUS

## 2019-03-20 NOTE — Progress Notes (Signed)
eLink Physician-Brief Progress Note Patient Name: Leonard Peters DOB: 05-Sep-1956 MRN: [id number]   Date of Service  03/20/2019  HPI/Events of Note  Notified of need for stress ulcer prophylaxis. Patient is intubated and ventilated.   eICU Interventions  Will order: 1. Protonix IV.      Intervention Category Intermediate Interventions: Best-practice therapies (e.g. DVT, beta blocker, etc.)  Raeqwon Lux Eugene 03/20/2019, 6:29 AM

## 2019-03-20 NOTE — Progress Notes (Signed)
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  During that hospitalization he also was positive for C. difficile.  He was evaluated by CT surgery and was determined to be elective aortic surgery and sent home to come back and have his ascending aortic replacement.  He presents to our ED brought in by the EMS as he was confused, incoherent and was covered from head to toe with wounds.   The vitals obtained by EMS was his respiratory rate was 38, blood pressure 116/74, heart rate of 120 and blood glucose of 185 and CO2 of 13.  There was a history of being treated for staph infection.   In the ED he was confused and unable to give a proper history.  His vitals were temperature of 97.8 blood pressure of 109/88.  He had CT of the head and blood culture was sent and other labs revealed a white count of 25.6, sodium of 131, potassium of 2.1, creatinine of 4.60.  SARS-CoV-2 was negative .  urine tox screen was positive for cannabinoids and opiates.  Chest x-ray  showed prominent aorta.  There was a question of a small infiltrate on the right side.  He was started on broad-spectrum antibiotics with vancomycin and cefepime.  He also tested positive for C. difficile and was started on oral vancomycin.  I am seeing the patient as the blood culture shows MRSA.   On reviewing [facility] records he was in hospital recently in March when he was transferred there from [facility] after he had a fall.  At that time he was found to be hypoxic with the 87% pulse ox, positive d-dimer.  He was transferred to [facility] to rule out PE.  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  Sacrum and coccyx x-rays and CT were negative.  He was admitted to the TICU under the service of vascular for aortic dissection.  In preparation for surgery he underwent left heart catheterization which revealed  on 01/06/2019  mild non-flow-limiting coronary artery disease.  There was also small nondominant RCA.  There was proximal 30 to 40% LAD/diagonal stenosis.  LVEDP was 16mHg.  He was determined to be elective aortic surgery and sent home to come back and have his ascending aorta replacement in 1 month.  Vascular surgery also set up an appointment in a month to see him for descending aorta dissection and RLE arterial obstruction.  He was also seen by dermatology for a new rash on his arms and legs and was given topical steroid.  He was seen by dental for very poor dentition and had multiple extraction on 01/16/2019 which he tolerated well.  For chronic leg weakness and recent fall he was seen by PT.  He is set up for home health PT.  He was also counseled on smoking cessation as a strategy to reduce risk of cardiovascular disease and events.  He was treated for C. difficile colitis while  in the hospital.  He also was found to have high creatinine of 2 on 01/04/2019 which on  01/16/2019 was 0.63. Patient failed to follow-up with them as outpatient.   ? Principal medical problems Aortic dissection thoracoabdominal aorta Inflammatory arthritis/rheumatoid arthritis Marfan syndrome Cervical myelopathy C. difficile colitis Hypertension Heart disease  Past surgical history Cervical spine surgery arthrodesis anterior interbody INC discectomy cervical below C2 on 04/22/7627 with application of intervertebral biomechanical disease  Dental extraction on 3   Subjective: Intubated and sedated On pressor Urine output declining but creatinine improving  Medications:  . chlorhexidine gluconate (MEDLINE KIT)  15 mL Mouth Rinse BID  . Chlorhexidine Gluconate Cloth  6 each Topical Q0600  . feeding supplement (PRO-STAT SUGAR FREE 64)  30 mL Per Tube BID  . FLUoxetine  40 mg Per Tube Daily  . folic acid  1 mg Per Tube Daily  . heparin injection (subcutaneous)  5,000 Units Subcutaneous Q8H  . insulin aspart  0-15 Units Subcutaneous Q4H  . mouth rinse  15 mL Mouth Rinse 10 times per day  . multivitamin  15 mL Per Tube Daily  . mupirocin ointment  1 application Nasal BID  . nutrition supplement (JUVEN)  1 packet Per Tube BID BM  . pantoprazole (PROTONIX) IV  40 mg Intravenous Q24H  . thiamine  100 mg Per Tube Daily  . vancomycin  500 mg Oral Q6H    Objective: Vital signs in last 24 hours: Temp:  [98.8 F (37.1 C)-99 F (37.2 C)] 99 F (37.2 C) (05/21 0700) Pulse Rate:  [66-138] 138 (05/21 1538) Resp:  [12-23] 23 (05/21 1530) BP: (87-114)/(35-98) 103/83 (05/21 1530) SpO2:  [92 %-100 %] 98 % (05/21 1530) FiO2 (%):  [35 %] 35 % (05/21 1540)  PHYSICAL EXAM:  General: intubated Lungs: b/l air entry Heart: Regular rate and rhythm, no murmur, rub or gallop. Abdomen: Soft, non-tender,not distended. Bowel sounds normal. No masses Extremities: hemorrhagic , necrotic lesions periphery- toes on both feet and some fingers Skin:              Lymph: Cervical, supraclavicular normal. Neurologic: Grossly non-focal  Lab Results Recent Labs    03/19/19 0243 03/20/19 0425  WBC 19.6* 19.2*  HGB 8.5* 7.6*  HCT 25.1* 23.2*  NA 135 138  K 3.8 4.0  CL 101 105  CO2 24 26  BUN 51* 50*  CREATININE 2.32* 2.36*   Liver Panel No results for input(s): PROT, ALBUMIN, AST, ALT, ALKPHOS, BILITOT, BILIDIR, IBILI in the last 72 hours. Sedimentation Rate No results for input(s): ESRSEDRATE in the last 72 hours.  C-Reactive Protein No results for input(s): CRP in the last 72 hours.  Microbiology:  Studies/Results: Ct Head Wo Contrast  Result Date: 03/20/2019 CLINICAL DATA:  Lung cancer staging EXAM: CT HEAD WITHOUT CONTRAST TECHNIQUE: Contiguous axial images were obtained from the base of the skull through the vertex without intravenous contrast. COMPARISON:  None. FINDINGS: Brain: There is no mass, hemorrhage or extra-axial collection. The size and configuration of the ventricles and extra-axial CSF spaces are normal. There is hypoattenuation of the white matter, most commonly indicating chronic small vessel disease. Vascular: No abnormal hyperdensity of the major intracranial arteries or dural venous sinuses. No intracranial atherosclerosis. Skull: The visualized skull base, calvarium and extracranial soft tissues are normal. Sinuses/Orbits: No fluid levels or advanced mucosal thickening of the visualized paranasal sinuses. No mastoid or middle ear effusion. The orbits are normal. IMPRESSION: Mild chronic white matter small vessel ischemic changes  without acute abnormality. No evidence for intracranial metastatic disease on this noncontrast examination. Electronically Signed   By: Ulyses Jarred M.D.   On: 03/20/2019 15:49   Ct Chest Wo Contrast  Result Date: 03/20/2019 CLINICAL DATA:  Ventilator dependence.  Decreased breath sounds. EXAM: CT CHEST WITHOUT CONTRAST TECHNIQUE: Multidetector CT imaging of the chest was performed following the standard protocol without IV contrast. COMPARISON:  03/19/2019 chest x-ray FINDINGS: Cardiovascular: The heart size is normal. No substantial pericardial effusion. Coronary artery calcification is evident. Ascending thoracic aorta measures up to 5.5 cm diameter. Atherosclerotic calcification is noted in the wall of the thoracic aorta. Right central line tip is positioned in the mid SVC. Mediastinum/Nodes: Endotracheal tube tip visualized in the upper trachea. 11 mm short  axis right paratracheal lymph node is mildly enlarged. Hilar regions not well evaluated for lymphadenopathy given collapse/consolidative changes in the parahilar lungs. The esophagus has normal imaging features. There is no axillary lymphadenopathy. Lungs/Pleura: Interstitial and patchy airspace disease is identified in the posterior right upper lobe and right middle lobe with collapse/consolidation of the right lower lobe. Similar mild interstitial and patchy airspace opacity noted posterior left upper lobe with collapse/consolidative change in the left lower lobe. Small to moderate pleural effusions are noted bilaterally. Upper Abdomen: Perihepatic ascites noted in the upper abdomen. Stomach is distended with fluid. Aorta measures 5.2 cm in diameter at the level of the diaphragmatic hiatus. Musculoskeletal: No worrisome lytic or sclerotic osseous abnormality. Multiple nonacute right rib fractures are evident with possible acute nondisplaced fracture of the lateral right fifth rib (91/2). Mild superior endplate compression deformity noted at T5. Patient is status post cervicothoracic fusion. IMPRESSION: 1. Bilateral lower lobe collapse/consolidation with small to moderate bilateral pleural effusions. Interstitial and patchy airspace disease noted in both upper lobes. Imaging features compatible with multifocal pneumonia. 2. Mild right paratracheal lymphadenopathy is likely reactive. 3. Ascending thoracic aorta measures 5.5 cm diameter. Descending aorta measures 5.2 cm diameter at the level of the diaphragmatic hiatus. Recommend semi-annual imaging followup by CTA or MRA and referral to cardiothoracic surgery if not already obtained. This recommendation follows 2010 ACCF/AHA/AATS/ACR/ASA/SCA/SCAI/SIR/STS/SVM Guidelines for the Diagnosis and Management of Patients With Thoracic Aortic Disease. Circulation. 2010; 121: [id number]. Aortic aneurysm NOS (ICD10-I71.9) Electronically Signed   By: Misty Stanley M.D.   On:  03/20/2019 16:10   Dg Chest Port 1 View  Result Date: 03/20/2019 CLINICAL DATA:  63 year old male central line placement. EXAM: PORTABLE CHEST 1 VIEW COMPARISON:  0302 hours the same day and earlier. FINDINGS: Portable AP supine view at 2342 hours. Endotracheal tube tip at the level the clavicles. Enteric tube courses to the abdomen, tip not included. New right IJ central line in place, tip at or just below the level of the carina. No pneumothorax. Stable lung volumes. Stable mediastinal contours allowing for differences in rotation. Veiling opacity at the right lung base has increased since 02/28/2019. Pulmonary vascularity has increased. Extensive previous posterior cervical and upper thoracic spine fusion. IMPRESSION: 1. Right IJ central line placed, tip at the SVC level. No pneumothorax. 2. ETT tip at the level the clavicles. Enteric tube courses to the abdomen. 3. Suspect increasing right pleural effusion since 03/02/2019, and borderline to mild interstitial edema. Electronically Signed   By: Genevie Ann M.D.   On: 03/20/2019 00:08   Dg Chest Port 1 View  Result Date: 03/19/2019 CLINICAL DATA:  62 y/o  M; acute respiratory failure. EXAM: PORTABLE CHEST 1 VIEW COMPARISON:  03/18/2019 chest radiograph. FINDINGS:  Stable cardiac silhouette and uncoiled aorta given oblique technique. Endotracheal tube tip projects 6.5 cm above the carina. Enteric tube tip extends below the field of view into the abdomen. Increasing basilar infiltrates greater on the left. Possible small effusions. No pneumothorax. Cervical fusion hardware, partially visualized. No acute osseous abnormality is evident. IMPRESSION: Stable support apparatus. Increasing basilar infiltrates greater on the left. Possible small effusions. Electronically Signed   By: Kristine Garbe M.D.   On: 03/19/2019 03:58   Dg Abd Portable 1v  Result Date: 03/19/2019 CLINICAL DATA:  63 year old male tube placement. EXAM: PORTABLE ABDOMEN - 1 VIEW  COMPARISON:  1535 hours today. FINDINGS: Portable AP semi upright view at 1749 hours. The NG tube has been removed and enteric feeding tube has been placed. The tip projects in the medial lower abdomen, likely within the distal aspect of a ptotic stomach. There probably is adequate slack to allow for post pyloric transit. Otherwise stable abdomen and visible lower chest. IMPRESSION: Feeding tube placed, tip likely in the distal aspect of a ptotic stomach. Electronically Signed   By: Genevie Ann M.D.   On: 03/19/2019 19:59   Dg Abd Portable 1v  Result Date: 03/19/2019 CLINICAL DATA:  OG tube placement EXAM: PORTABLE ABDOMEN - 1 VIEW COMPARISON:  Earlier same day; 03/18/2019 FINDINGS: Interval advancement of enteric tube with side port now projected the expected location of the GE junction, similar to the 02/2018 examination. Gaseous distention of the colon and cecum. No pneumoperitoneum, pneumatosis or portal venous gas. Limited visualization of the lower thorax demonstrates suspected trace bilateral effusions with associated bibasilar opacities. Mild scoliotic curvature of the thoracolumbar spine, potentially positional. IMPRESSION: Interval advancement of enteric tube with side port now projected over the expected location of the GE junction. Advancement an additional 8 cm could be performed as indicated. Electronically Signed   By: Sandi Mariscal M.D.   On: 03/19/2019 16:11   Dg Abd Portable 1v  Result Date: 03/19/2019 CLINICAL DATA:  Ileus. EXAM: PORTABLE ABDOMEN - 1 VIEW COMPARISON:  03/18/2019 FINDINGS: There is gaseous distention of multiple loops of small bowel and colon. There is a relative paucity of bowel gas at the level of the rectum. The nasogastric tube is suboptimally positioned in the esophagus. Bibasilar airspace opacities are noted IMPRESSION: 1. Nasogastric tube in the esophagus.  Repositioning is recommended. 2. Persistent mildly dilated loops of small bowel. 3. Bibasilar airspace opacities.  Consider dedicated chest radiograph for further evaluation. Electronically Signed   By: Constance Holster M.D.   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  During that hospitalization he also was positive for C. difficile.  He was evaluated by CT surgery and was determined to be elective aortic surgery and sent home to come back and have his ascending aortic replacement.  He presents to our ED brought in by the EMS as he was confused, incoherent and was covered from head to toe with wounds.   ? 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 Plan was to do surgery at [facility] but he did not follow up Concern for infected thrombus   Marfans syndrome ( mentioned in [facility] record  Cdiff diarrhea- was treated in March for cdiff- positive here and on IV metronidazole- once PO allowed will have to go on Po vanco or PO fidoximicin  B/l purulent ear discharge- CT head did not reveal any acute abnormality- may need  mastoid/inner ear focused films  Discussed the management with his nurse

## 2019-03-20 NOTE — Progress Notes (Signed)
Pt was transported to CT from CCU and back while on the ventilator. 

## 2019-03-20 NOTE — Progress Notes (Signed)
Pt had an acute desaturation while supine for bath to 79% on 35% FiO2.  Increased FiO2 to 50%.  Will attempt to wean to 35%.

## 2019-03-20 NOTE — Progress Notes (Signed)
Patient unable to follow commands. CT head and chest performed. Tolerating tube feeds, foley intact with 145 ml for shift. Dr Belia Heman will repeat a CMP 5/22. Rectal tube intact. Some leakage around insertion site liquid stool. Patients levophed titrated for map of 65. Continue to monitor.

## 2019-03-20 NOTE — Progress Notes (Signed)
CRITICAL CARE NOTE  CC  respiratory failure  SUBJECTIVE Patient remains critically ill Prognosis is guarded Full vent support +DT's TEE neg for ENDOCARDITIS Shock-on pressors   BP (!) 97/50   Pulse 80   Temp 98.8 F (37.1 C) (Oral)   Resp 15   Ht _0  (1.905 m)   Wt 67.3 kg   SpO2 100%   BMI 18.54 kg/m    I/O last 3 completed shifts: In: 7935.6 [I.V.:3957.3; NG/GT:162; IV Piggyback:3816.3] Out: 785 [Urine:360; Drains:50; Stool:375] No intake/output data recorded.  SpO2: 100 % O2 Flow Rate (L/min): 0 L/min FiO2 (%): 35 %  CBC    Component Value Date/Time   WBC 19.2 (H) 03/20/2019 0425   RBC 2.46 (L) 03/20/2019 0425   HGB 7.6 (L) 03/20/2019 0425   HCT 23.2 (L) 03/20/2019 0425   PLT 67 (L) 03/20/2019 0425   MCV 94.3 03/20/2019 0425   MCH 30.9 03/20/2019 0425   MCHC 32.8 03/20/2019 0425   RDW 15.5 03/20/2019 0425   LYMPHSABS 1.6 03/18/2019 0422   MONOABS 0.3 03/18/2019 0422   EOSABS 0.0 03/18/2019 0422   BASOSABS 0.1 03/18/2019 0422   BMP Latest Ref Rng & Units 03/20/2019 03/19/2019 03/18/2019  Glucose 70 - 99 mg/dL 210(H) 84 80  BUN 8 - 23 mg/dL 50(H) 51(H) 55(H)  Creatinine 0.61 - 1.24 mg/dL 2.36(H) 2.32(H) 2.77(H)  Sodium 135 - 145 mmol/L 138 135 136  Potassium 3.5 - 5.1 mmol/L 4.0 3.8 3.7  Chloride 98 - 111 mmol/L 105 101 106  CO2 22 - 32 mmol/L 26 24 16(L)  Calcium 8.9 - 10.3 mg/dL 7.7(L) 7.3(L) 7.4(L)     SIGNIFICANT EVENTS 5/17 admitted for sepsis 5/18 C diff POS, MRSA +bacteremia, MRI c/w RT elbow OSTEO 5/19 DT's and Severe resp failure, ETT 7.5, aspiration pneumonia 5/20 TEE negative 5/21 on vent and pressors  REVIEW OF SYSTEMS  PATIENT IS UNABLE TO PROVIDE COMPLETE REVIEW OF SYSTEMS DUE TO SEVERE CRITICAL ILLNESS   PHYSICAL EXAMINATION:  GENERAL:critically ill appearing, +resp distress HEAD: Normocephalic, atraumatic.  EYES: Pupils equal, round, reactive to light.  No scleral icterus.  MOUTH: Moist mucosal membrane. NECK: Supple. No  thyromegaly. No nodules. No JVD.  PULMONARY: +rhonchi, +wheezing CARDIOVASCULAR: S1 and S2. Regular rate and rhythm. No murmurs, rubs, or gallops.  GASTROINTESTINAL: Soft, nontender, -distended. No masses. Positive bowel sounds. No hepatosplenomegaly.  MUSCULOSKELETAL: No swelling, clubbing, or edema.  NEUROLOGIC: obtunded, GCS<8 SKIN:intact,warm,dry  MEDICATIONS: I have reviewed all medications and confirmed regimen as documented   CULTURE RESULTS   Recent Results (from the past 240 hour(s))  Blood Culture (routine x 2)     Status: Abnormal   Collection Time: 02/28/2019 12:16 PM  Result Value Ref Range Status   Specimen Description   Final    BLOOD BLOOD RIGHT FOREARM Performed at [facility], [address], [place], [place] [address]    Special Requests   Final    BOTTLES DRAWN AEROBIC AND ANAEROBIC Blood Culture results may not be optimal due to an excessive volume of blood received in culture bottles Performed at [facility], [address], [place], [place] [address]    Culture  Setup Time   Final    GRAM POSITIVE COCCI IN BOTH AEROBIC AND ANAEROBIC BOTTLES CRITICAL RESULT CALLED TO, READ BACK BY AND VERIFIED WITH: JASON ROBBINS ON 03/17/2019 AT 0012 QSD Performed at [facility], [address], [place],  [address]    Culture (A)  Final  STAPHYLOCOCCUS AUREUS SUSCEPTIBILITIES PERFORMED ON PREVIOUS CULTURE WITHIN THE LAST 5 DAYS. Performed at [facility], [place] [address], [place], [place] [address]    Report Status 03/19/2019 FINAL  Final  Blood Culture (routine x 2)     Status: Abnormal   Collection Time: 03/08/2019 12:16 PM  Result Value Ref Range Status   Specimen Description   Final    BLOOD LAC Performed at [facility], [address] [place], [place] [address]    Special Requests   Final    BOTTLES DRAWN AEROBIC AND ANAEROBIC Blood Culture results may not be optimal due to an excessive volume of blood  received in culture bottles Performed at [facility], [address], [place], [place] [address]    Culture  Setup Time   Final    GRAM POSITIVE COCCI IN BOTH AEROBIC AND ANAEROBIC BOTTLES CRITICAL RESULT CALLED TO, READ BACK BY AND VERIFIED WITH: JASON ROBBINS ON 03/17/2019 AT 0012 QSD Performed at [facility], [place] [address], [place], [place] [address]    Culture METHICILLIN RESISTANT STAPHYLOCOCCUS AUREUS (A)  Final   Report Status 03/19/2019 FINAL  Final   Organism ID, Bacteria METHICILLIN RESISTANT STAPHYLOCOCCUS AUREUS  Final      Susceptibility   Methicillin resistant staphylococcus aureus - MIC*    CIPROFLOXACIN <=0.5 SENSITIVE Sensitive     ERYTHROMYCIN <=0.25 SENSITIVE Sensitive     GENTAMICIN <=0.5 SENSITIVE Sensitive     OXACILLIN >=4 RESISTANT Resistant     TETRACYCLINE <=1 SENSITIVE Sensitive     VANCOMYCIN 1 SENSITIVE Sensitive     TRIMETH/SULFA <=10 SENSITIVE Sensitive     CLINDAMYCIN <=0.25 SENSITIVE Sensitive     RIFAMPIN <=0.5 SENSITIVE Sensitive     Inducible Clindamycin NEGATIVE Sensitive     * METHICILLIN RESISTANT STAPHYLOCOCCUS AUREUS  Urine culture     Status: Abnormal   Collection Time: 03/30/2019 12:16 PM  Result Value Ref Range Status   Specimen Description   Final    URINE, RANDOM Performed at [facility], [address] [place], [place] [address]    Special Requests   Final    NONE Performed at [facility], [address] [place], [place] [address]    Culture (A)  Final    >=100,000 COLONIES/mL METHICILLIN RESISTANT STAPHYLOCOCCUS AUREUS   Report Status 03/18/2019 FINAL  Final   Organism ID, Bacteria METHICILLIN RESISTANT STAPHYLOCOCCUS AUREUS (A)  Final      Susceptibility   Methicillin resistant staphylococcus aureus - MIC*    CIPROFLOXACIN <=0.5 SENSITIVE Sensitive     GENTAMICIN <=0.5 SENSITIVE Sensitive     NITROFURANTOIN 32 SENSITIVE Sensitive     OXACILLIN >=4 RESISTANT Resistant     TETRACYCLINE  <=1 SENSITIVE Sensitive     VANCOMYCIN 1 SENSITIVE Sensitive     TRIMETH/SULFA <=10 SENSITIVE Sensitive     CLINDAMYCIN <=0.25 SENSITIVE Sensitive     RIFAMPIN <=0.5 SENSITIVE Sensitive     Inducible Clindamycin NEGATIVE Sensitive     * >=100,000 COLONIES/mL METHICILLIN RESISTANT STAPHYLOCOCCUS AUREUS  SARS Coronavirus 2 (CEPHEID- Performed in [place] hospital lab), Hosp Order     Status: None   Collection Time: 03/01/2019 12:16 PM  Result Value Ref Range Status   SARS Coronavirus 2 NEGATIVE NEGATIVE Final    Comment: (NOTE) If result is NEGATIVE SARS-CoV-2 target nucleic acids are NOT DETECTED. The SARS-CoV-2 RNA is generally detectable in upper and lower  respiratory specimens during the acute phase of infection.  The lowest  concentration of SARS-CoV-2 viral copies this assay can detect is 250  copies / mL. A negative result does not preclude SARS-CoV-2 infection  and should not be used as the sole basis for treatment or other  patient management decisions.  A negative result may occur with  improper specimen collection / handling, submission of specimen other  than nasopharyngeal swab, presence of viral mutation(s) within the  areas targeted by this assay, and inadequate number of viral copies  (<250 copies / mL). A negative result must be combined with clinical  observations, patient history, and epidemiological information. If result is POSITIVE SARS-CoV-2 target nucleic acids are DETECTED. The SARS-CoV-2 RNA is generally detectable in upper and lower  respiratory specimens dur ing the acute phase of infection.  Positive  results are indicative of active infection with SARS-CoV-2.  Clinical  correlation with patient history and other diagnostic information is  necessary to determine patient infection status.  Positive results do  not rule out bacterial infection or co-infection with other viruses. If result is PRESUMPTIVE POSTIVE SARS-CoV-2 nucleic acids MAY BE PRESENT.   A  presumptive positive result was obtained on the submitted specimen  and confirmed on repeat testing.  While 2019 novel coronavirus  (SARS-CoV-2) nucleic acids may be present in the submitted sample  additional confirmatory testing may be necessary for epidemiological  and / or clinical management purposes  to differentiate between  SARS-CoV-2 and other Sarbecovirus currently known to infect humans.  If clinically indicated additional testing with an alternate test  methodology [phone]) is advised. The SARS-CoV-2 RNA is generally  detectable in upper and lower respiratory sp ecimens during the acute  phase of infection. The expected result is Negative. Fact Sheet for Patients:  StrictlyIdeas.no Fact Sheet for Healthcare Providers: BankingDealers.co.za This test is not yet approved or cleared by the [place] FDA and has been authorized for detection and/or diagnosis of SARS-CoV-2 by FDA under an Emergency Use Authorization (EUA).  This EUA will remain in effect (meaning this test can be used) for the duration of the COVID-19 declaration under Section 564(b)(1) of the Act, 21 U.S.C. section 360bbb-3(b)(1), unless the authorization is terminated or revoked sooner. Performed at [facility], [address] [place], [place] [address]   Blood Culture ID Panel (Reflexed)     Status: Abnormal   Collection Time: 03/17/2019 12:16 PM  Result Value Ref Range Status   Enterococcus species NOT DETECTED NOT DETECTED Final   Listeria monocytogenes NOT DETECTED NOT DETECTED Final   Staphylococcus species DETECTED (A) NOT DETECTED Final    Comment: CRITICAL RESULT CALLED TO, READ BACK BY AND VERIFIED WITH: JASON ROBBINS ON 03/17/2019 AT 0012 QSD    Staphylococcus aureus (BCID) DETECTED (A) NOT DETECTED Final    Comment: Methicillin (oxacillin)-resistant Staphylococcus aureus (MRSA). MRSA is predictably resistant to beta-lactam antibiotics  (except ceftaroline). Preferred therapy is vancomycin unless clinically contraindicated. Patient requires contact precautions if  hospitalized. CRITICAL RESULT CALLED TO, READ BACK BY AND VERIFIED WITH: JASON ROBBINS ON 03/17/2019 AT 0012 QSD    Methicillin resistance DETECTED (A) NOT DETECTED Final    Comment: CRITICAL RESULT CALLED TO, READ BACK BY AND VERIFIED WITH: JASON ROBBINS ON 03/17/2019 AT 0012 QSD    Streptococcus species NOT DETECTED NOT DETECTED Final   Streptococcus agalactiae NOT DETECTED NOT DETECTED Final   Streptococcus pneumoniae NOT DETECTED NOT DETECTED Final   Streptococcus pyogenes NOT DETECTED NOT DETECTED Final   Acinetobacter baumannii NOT DETECTED NOT DETECTED Final  Enterobacteriaceae species NOT DETECTED NOT DETECTED Final   Enterobacter cloacae complex NOT DETECTED NOT DETECTED Final   Escherichia coli NOT DETECTED NOT DETECTED Final   Klebsiella oxytoca NOT DETECTED NOT DETECTED Final   Klebsiella pneumoniae NOT DETECTED NOT DETECTED Final   Proteus species NOT DETECTED NOT DETECTED Final   Serratia marcescens NOT DETECTED NOT DETECTED Final   Haemophilus influenzae NOT DETECTED NOT DETECTED Final   Neisseria meningitidis NOT DETECTED NOT DETECTED Final   Pseudomonas aeruginosa NOT DETECTED NOT DETECTED Final   Candida albicans NOT DETECTED NOT DETECTED Final   Candida glabrata NOT DETECTED NOT DETECTED Final   Candida krusei NOT DETECTED NOT DETECTED Final   Candida parapsilosis NOT DETECTED NOT DETECTED Final   Candida tropicalis NOT DETECTED NOT DETECTED Final    Comment: Performed at [facility], [address] [place], [place] [address]  MRSA PCR Screening     Status: Abnormal   Collection Time: 02/28/2019  6:16 PM  Result Value Ref Range Status   MRSA by PCR POSITIVE (A) NEGATIVE Final    Comment:        The GeneXpert MRSA Assay (FDA approved for NASAL specimens only), is one component of a comprehensive MRSA  colonization surveillance program. It is not intended to diagnose MRSA infection nor to guide or monitor treatment for MRSA infections. RESULT CALLED TO, READ BACK BY AND VERIFIED WITH: PREDOME,F AT 2001 ON 03/09/2019 BY MOSLEY,J Performed at [facility], [address] [place], [place] [address]   C difficile quick scan w PCR reflex     Status: Abnormal   Collection Time: 03/17/19  4:45 AM  Result Value Ref Range Status   C Diff antigen POSITIVE (A) NEGATIVE Final   C Diff toxin POSITIVE (A) NEGATIVE Corrected    Comment: CRITICAL RESULT CALLED TO, READ BACK BY AND VERIFIED WITH: FELICIA PREUDHOMME <[id number]>_6<\/[id number]>_5  03/17/19 [facility] CORRECTED ON 05/19 AT 0853: PREVIOUSLY REPORTED AS POSITIVE RESULT CALLED TO, READ BACK BY AND VERIFIED WITH: FELICIA PREUDHOMME <[id number]>_3<\/[id number]>_0  03/17/19 AKT    C Diff interpretation Toxin producing C. difficile detected.  Final    Comment: Performed at [facility], [address] [place], [place] [address]  CULTURE, BLOOD (ROUTINE X 2) w Reflex to ID Panel     Status: None (Preliminary result)   Collection Time: 03/18/19 10:38 AM  Result Value Ref Range Status   Specimen Description BLOOD RIGHT HAND  Final   Special Requests   Final    BOTTLES DRAWN AEROBIC AND ANAEROBIC Blood Culture adequate volume   Culture   Final    NO GROWTH < 24 HOURS Performed at [facility], [address] [place], [place] [address]    Report Status PENDING  Incomplete  CULTURE, BLOOD (ROUTINE X 2) w Reflex to ID Panel     Status: None (Preliminary result)   Collection Time: 03/18/19 10:38 AM  Result Value Ref Range Status   Specimen Description BLOOD RIGHT WRIST  Final   Special Requests   Final    BOTTLES DRAWN AEROBIC AND ANAEROBIC Blood Culture adequate volume   Culture   Final    NO GROWTH < 24 HOURS Performed at [facility], [address], [place], [place] [address]    Report Status PENDING  Incomplete          IMAGING    Dg Chest Port  1 View  Result Date: 03/20/2019 CLINICAL DATA:  63 year old male central line placement. EXAM: PORTABLE CHEST  1 VIEW COMPARISON:  0302 hours the same day and earlier. FINDINGS: Portable AP supine view at 2342 hours. Endotracheal tube tip at the level the clavicles. Enteric tube courses to the abdomen, tip not included. New right IJ central line in place, tip at or just below the level of the carina. No pneumothorax. Stable lung volumes. Stable mediastinal contours allowing for differences in rotation. Veiling opacity at the right lung base has increased since 03/07/2019. Pulmonary vascularity has increased. Extensive previous posterior cervical and upper thoracic spine fusion. IMPRESSION: 1. Right IJ central line placed, tip at the SVC level. No pneumothorax. 2. ETT tip at the level the clavicles. Enteric tube courses to the abdomen. 3. Suspect increasing right pleural effusion since 03/30/2019, and borderline to mild interstitial edema. Electronically Signed   By: Genevie Ann M.D.   On: 03/20/2019 00:08   Dg Abd Portable 1v  Result Date: 03/19/2019 CLINICAL DATA:  63 year old male tube placement. EXAM: PORTABLE ABDOMEN - 1 VIEW COMPARISON:  1535 hours today. FINDINGS: Portable AP semi upright view at 1749 hours. The NG tube has been removed and enteric feeding tube has been placed. The tip projects in the medial lower abdomen, likely within the distal aspect of a ptotic stomach. There probably is adequate slack to allow for post pyloric transit. Otherwise stable abdomen and visible lower chest. IMPRESSION: Feeding tube placed, tip likely in the distal aspect of a ptotic stomach. Electronically Signed   By: Genevie Ann M.D.   On: 03/19/2019 19:59   Dg Abd Portable 1v  Result Date: 03/19/2019 CLINICAL DATA:  OG tube placement EXAM: PORTABLE ABDOMEN - 1 VIEW COMPARISON:  Earlier same day; 03/18/2019 FINDINGS: Interval advancement of enteric tube with side port now projected the expected location of the GE junction,  similar to the 02/2018 examination. Gaseous distention of the colon and cecum. No pneumoperitoneum, pneumatosis or portal venous gas. Limited visualization of the lower thorax demonstrates suspected trace bilateral effusions with associated bibasilar opacities. Mild scoliotic curvature of the thoracolumbar spine, potentially positional. IMPRESSION: Interval advancement of enteric tube with side port now projected over the expected location of the GE junction. Advancement an additional 8 cm could be performed as indicated. Electronically Signed   By: Sandi Mariscal M.D.   On: 03/19/2019 16:11   Dg Abd Portable 1v  Result Date: 03/19/2019 CLINICAL DATA:  Ileus. EXAM: PORTABLE ABDOMEN - 1 VIEW COMPARISON:  03/18/2019 FINDINGS: There is gaseous distention of multiple loops of small bowel and colon. There is a relative paucity of bowel gas at the level of the rectum. The nasogastric tube is suboptimally positioned in the esophagus. Bibasilar airspace opacities are noted IMPRESSION: 1. Nasogastric tube in the esophagus.  Repositioning is recommended. 2. Persistent mildly dilated loops of small bowel. 3. Bibasilar airspace opacities. Consider dedicated chest radiograph for further evaluation. Electronically Signed   By: Constance Holster M.D.   On: 03/19/2019 13:41       Indwelling Urinary Catheter continued, requirement due to   Reason to continue Indwelling Urinary Catheter strict Intake/Output monitoring for hemodynamic instability   Central Line/ continued, requirement due to  Reason to continue [place] of central venous pressure or other hemodynamic parameters and poor IV access   Ventilator continued, requirement due to severe respiratory failure   Ventilator Sedation RASS 0 to -2      ASSESSMENT AND PLAN SYNOPSIS  63 yo WM with acute metabolic encephalopathy from severe metabolic acidosis and severe sepsis with RT mid opacity  likely pneumonia(?mass) with left elbow effusion and  multiple skin lesions complicated by acute renal failure associated with malnourishment and FTT.Now complicated by acute resp failure requiring intubation with DT's and aspiration pneumonia   Severe ACUTE Hypoxic and Hypercapnic Respiratory Failure -continue Full MV support -continue Bronchodilator Therapy -Wean Fio2 and PEEP as tolerated -will perform SAT/SBT when respiratory parameters are met   ACUTE KIDNEY INJURY/Renal Failure -follow chem 7 -follow UO -continue Foley Catheter-assess need -Avoid nephrotoxic agents -Recheck creatinine     NEUROLOGY - intubated and sedated - minimal sedation to achieve a RASS goal: -1 Wake up assessment pending Severe DT's  SHOCK-SEPSIS/HYPOVOLUMIC/CARDIOGENIC -use vasopressors to keep MAP>65 -follow ABG and LA -follow up cultures -emperic ABX -consider stress dose steroids -aggressive IV fluid resuscitation  CARDIAC ICU monitoring  ID -continue IV abx as prescibed -follow up cultures  GI GI PROPHYLAXIS as indicated  NUTRITIONAL STATUS DIET-->TF's as tolerated Constipation protocol as indicated  ENDO - will use ICU hypoglycemic\Hyperglycemia protocol if indicated   ELECTROLYTES -follow labs as needed -replace as needed -pharmacy consultation and following   DVT/GI PRX ordered TRANSFUSIONS AS NEEDED MONITOR FSBS ASSESS the need for LABS as needed   Critical Care Time devoted to patient care services described in this note is 34  minutes.   Overall, patient is critically ill, prognosis is guarded.  Patient with Multiorgan failure and at high risk for cardiac arrest and death.    Recommend DNR status Will obtain CT chest to assess for lung mass/pnuemonia  Corrin Parker, M.D.  [facility]  Medical Director [place] Director [facility] Cardio-Pulmonary Department

## 2019-03-20 NOTE — Progress Notes (Signed)
Pharmacy Electrolyte Monitoring Consult:  Pharmacy consulted to assist in monitoring and replacing electrolytes in this 64 y.o. male admitted on 2019-04-13 with Altered Mental Status   Labs:  Sodium (mmol/L)  Date Value  03/20/2019 138   Potassium (mmol/L)  Date Value  03/20/2019 4.0   Magnesium (mg/dL)  Date Value  55/97/4163 1.8   Phosphorus (mg/dL)  Date Value  84/53/6468 4.2   Calcium (mg/dL)  Date Value  01/18/2247 7.7 (L)   Albumin (g/dL)  Date Value  25/00/3704 1.7 (L)   Corrected calcium: 9  Assessment/Plan: Magnesium 2g IV x 1.   Will check all electrolytes with am labs.   Will replace for goal potassium ~ 4 and goal magnesium ~ 2.   Pharmacy will continue to monitor and adjust per consult.   Simpson,Michael L 03/20/2019 5:02 PM

## 2019-03-20 NOTE — Progress Notes (Signed)
[facility] [place] [facility]   PATIENT NAME: Leonard Peters    MR#:  [id number]  DATE OF BIRTH:  [birth year]  SUBJECTIVE:  CHIEF COMPLAINT:   Chief Complaint  Patient presents with  . Altered Mental Status  Patient seen and evaluated today Intubated and on ventilator TEE negative for endocarditis  REVIEW OF SYSTEMS:    ROS Unable to obtain as patient is critically ill DRUG ALLERGIES:   Allergies  Allergen Reactions  . Lipitor [Atorvastatin Calcium]     Elevated LFTs    VITALS:  Blood pressure (!) 104/53, pulse 78, temperature 99 F (37.2 C), resp. rate 16, height 6\' 3"  (1.905 m), weight 67.3 kg, SpO2 100 %.  PHYSICAL EXAMINATION:   Physical Exam  GENERAL:  63 y.o.-year-old patient lying in the bed on ventilator EYES: Pupils equal, round, reactive to light and accommodation. No scleral icterus. Extraocular muscles intact.  HEENT: Head atraumatic, normocephalic. Oropharynx and nasopharynx clear.  NECK:  Supple, no jugular venous distention. No thyroid enlargement, no tenderness.  LUNGS: Decreased breath sounds bilaterally, rales heard in both lungs Tidal volume 450 Rate 15 PEEP 5 FiO2 35 % CARDIOVASCULAR: S1, S2 normal. No murmurs, rubs, or gallops.  ABDOMEN: Soft, nontender, nondistended. Bowel sounds present. No organomegaly or mass.  EXTREMITIES: No cyanosis, clubbing or edema b/l.    NEUROLOGIC: On ventilator could not be assessed PSYCHIATRIC: The patient is alert and oriented none SKIN: No obvious rash, lesion, or ulcer.   LABORATORY PANEL:   CBC Recent Labs  Lab 03/20/19 0425  WBC 19.2*  HGB 7.6*  HCT 23.2*  PLT 67*   ------------------------------------------------------------------------------------------------------------------ Chemistries  Recent Labs  Lab 03/07/2019 1216  03/20/19 0425  NA 131*   < > 138  K 2.1*   < > 4.0  CL 92*   < > 105  CO2 11*   < > 26  GLUCOSE 137*   < > 210*  BUN 58*   < > 50*  CREATININE  4.60*   < > 2.36*  CALCIUM 7.6*   < > 7.7*  MG  --    < > 1.8  AST 57*  --   --   ALT 18  --   --   ALKPHOS 123  --   --   BILITOT 0.7  --   --    < > = values in this interval not displayed.   ------------------------------------------------------------------------------------------------------------------  Cardiac Enzymes No results for input(s): TROPONINI in the last 168 hours. ------------------------------------------------------------------------------------------------------------------  RADIOLOGY:  Dg Chest Port 1 View  Result Date: 03/20/2019 CLINICAL DATA:  63 year old male central line placement. EXAM: PORTABLE CHEST 1 VIEW COMPARISON:  0302 hours the same day and earlier. FINDINGS: Portable AP supine view at 2342 hours. Endotracheal tube tip at the level the clavicles. Enteric tube courses to the abdomen, tip not included. New right IJ central line in place, tip at or just below the level of the carina. No pneumothorax. Stable lung volumes. Stable mediastinal contours allowing for differences in rotation. Veiling opacity at the right lung base has increased since 03/04/2019. Pulmonary vascularity has increased. Extensive previous posterior cervical and upper thoracic spine fusion. IMPRESSION: 1. Right IJ central line placed, tip at the SVC level. No pneumothorax. 2. ETT tip at the level the clavicles. Enteric tube courses to the abdomen. 3. Suspect increasing right pleural effusion since 03/01/2019, and borderline to mild interstitial edema. Electronically Signed   By: Odessa Fleming M.D.   On: 03/20/2019  00:08   Dg Chest Port 1 View  Result Date: 03/19/2019 CLINICAL DATA:  63 y/o  M; acute respiratory failure. EXAM: PORTABLE CHEST 1 VIEW COMPARISON:  03/18/2019 chest radiograph. FINDINGS: Stable cardiac silhouette and uncoiled aorta given oblique technique. Endotracheal tube tip projects 6.5 cm above the carina. Enteric tube tip extends below the field of view into the abdomen. Increasing  basilar infiltrates greater on the left. Possible small effusions. No pneumothorax. Cervical fusion hardware, partially visualized. No acute osseous abnormality is evident. IMPRESSION: Stable support apparatus. Increasing basilar infiltrates greater on the left. Possible small effusions. Electronically Signed   By: Mitzi Hansen M.D.   On: 03/19/2019 03:58   Dg Abd Portable 1v  Result Date: 03/19/2019 CLINICAL DATA:  63 year old male tube placement. EXAM: PORTABLE ABDOMEN - 1 VIEW COMPARISON:  1535 hours today. FINDINGS: Portable AP semi upright view at 1749 hours. The NG tube has been removed and enteric feeding tube has been placed. The tip projects in the medial lower abdomen, likely within the distal aspect of a ptotic stomach. There probably is adequate slack to allow for post pyloric transit. Otherwise stable abdomen and visible lower chest. IMPRESSION: Feeding tube placed, tip likely in the distal aspect of a ptotic stomach. Electronically Signed   By: Odessa Fleming M.D.   On: 03/19/2019 19:59   Dg Abd Portable 1v  Result Date: 03/19/2019 CLINICAL DATA:  OG tube placement EXAM: PORTABLE ABDOMEN - 1 VIEW COMPARISON:  Earlier same day; 03/18/2019 FINDINGS: Interval advancement of enteric tube with side port now projected the expected location of the GE junction, similar to the 02/2018 examination. Gaseous distention of the colon and cecum. No pneumoperitoneum, pneumatosis or portal venous gas. Limited visualization of the lower thorax demonstrates suspected trace bilateral effusions with associated bibasilar opacities. Mild scoliotic curvature of the thoracolumbar spine, potentially positional. IMPRESSION: Interval advancement of enteric tube with side port now projected over the expected location of the GE junction. Advancement an additional 8 cm could be performed as indicated. Electronically Signed   By: Simonne Come M.D.   On: 03/19/2019 16:11   Dg Abd Portable 1v  Result Date:  03/19/2019 CLINICAL DATA:  Ileus. EXAM: PORTABLE ABDOMEN - 1 VIEW COMPARISON:  03/18/2019 FINDINGS: There is gaseous distention of multiple loops of small bowel and colon. There is a relative paucity of bowel gas at the level of the rectum. The nasogastric tube is suboptimally positioned in the esophagus. Bibasilar airspace opacities are noted IMPRESSION: 1. Nasogastric tube in the esophagus.  Repositioning is recommended. 2. Persistent mildly dilated loops of small bowel. 3. Bibasilar airspace opacities. Consider dedicated chest radiograph for further evaluation. Electronically Signed   By: Katherine Mantle M.D.   On: 03/19/2019 13:41     ASSESSMENT AND PLAN:  63 year old male patient currently in the ICU for sepsis  -Acute hypoxic respiratory failure Continue mechanical ventilation Continue vent bundle PEEP as tolerated Intensivist follow-up  -Sepsis severe with hypovolemia and shock IV fluids Broad-spectrum antibiotics Follow-up cultures IV pressors as needed  -Acute Metabolic Encephalopathy Supprotive care  -MRSA bacteremia Continue broad-spectrum antibiotics TEE negative for endocarditis COVID test negative Infectious disease follow-up appreciated  -Type B aortic dissection Further plan of management as per intensivist and infectious disease attending Patient had a plan to do surgery at [facility] but no follow-up done  -Right lung pneumonia with opacity Continue IV antibiotics CT chest down the line  -Acute kidney injury Avoid nephrotoxic drugs IV fluids and monitor renal function  -Right  elbow cellulitis Follow-up MRI to check for any osteomyelitis  -Adult failure to thrive Nutritional supplements  -C. difficile colitis Contact and enteric precautions Continue Flagyl and vancomycin Switch to oral meds once tolerated  -Hypotension secondary to sepsis IV fluids and IV pressors as needed  -Acute systolic heart failure Lasix as needed Cardiology  recommendations  -Right elbow osteomyelitis Status post orthopedic evaluation Wound VAC recommended  -Palliative care on board   All the records are reviewed and case discussed with Care Management/Social Worker. Management plans discussed with the patient, family and they are in agreement.  CODE STATUS: Full code  DVT Prophylaxis: SCDs  TOTAL TIME TAKING CARE OF THIS PATIENT: 38 minutes.   POSSIBLE D/C IN 2 to 3 DAYS, DEPENDING ON CLINICAL CONDITION.  Ihor AustinPavan Massey Ruhland M.D on 03/20/2019 at 11:33 AM  Between 7am to 6pm - Pager - [phone]  After 6pm go to [email or web address] - password [email or web address]  [facility]  Office  [phone]  CC: Primary care physician; System, Pcp Not In  Note: This dictation was prepared with Dragon dictation along with smaller phrase technology. Any transcriptional errors that result from this process are unintentional.

## 2019-03-20 NOTE — Progress Notes (Signed)
Harlon Ditty, NP notified that patients hemoglobin 7.6 with am labs, no orders to transfuse at this time. Pts urine output is 75 mL for entire shift. CVP reading 7-8. 500 LR bolus ordered and administered. Pts BG now in 200s with tube feeds infusing. Sliding insulin coverage added and fluid infusing switched from LR-[id number] to LR.

## 2019-03-20 NOTE — Progress Notes (Signed)
Per Dr. Belia Heman stopped fentanyl for wake up assessment. MD informed of patients urine output of 75 cc throughout the night. At this point no additional orders. Continue to monitor.

## 2019-03-20 NOTE — Progress Notes (Signed)
Restarted fentanyl at half the dosage as patients heart rate got into the 140's. Will continue to monitor patient.

## 2019-03-20 NOTE — Progress Notes (Signed)
Restarted fentanyl  

## 2019-03-20 NOTE — Progress Notes (Signed)
Daily Progress Note   Patient Name: Leonard Peters       Date: 03/20/2019 DOB: 03-26-1956  Age: 63 y.o. MRN#: [id number] Attending Physician: Saundra Shelling, MD Primary Care Physician: System, Pcp Not In Admit Date: 03/18/2019  Reason for Consultation/Follow-up: Establishing goals of care  Subjective: Remains intubated. Attempting wake up assessment this AM. Fentanyl off. Patient responds to pain, but otherwise not waking up and following commands. No s/s of pain or discomfort.   Length of Stay: 4  Current Medications: Scheduled Meds:  . chlorhexidine gluconate (MEDLINE KIT)  15 mL Mouth Rinse BID  . Chlorhexidine Gluconate Cloth  6 each Topical Q0600  . feeding supplement (PRO-STAT SUGAR FREE 64)  30 mL Per Tube BID  . FLUoxetine  40 mg Per Tube Daily  . folic acid  1 mg Per Tube Daily  . heparin injection (subcutaneous)  5,000 Units Subcutaneous Q8H  . insulin aspart  0-15 Units Subcutaneous Q4H  . mouth rinse  15 mL Mouth Rinse 10 times per day  . multivitamin  15 mL Per Tube Daily  . mupirocin ointment  1 application Nasal BID  . nutrition supplement (JUVEN)  1 packet Per Tube BID BM  . pantoprazole (PROTONIX) IV  40 mg Intravenous Q24H  . thiamine  100 mg Per Tube Daily  . vancomycin  500 mg Oral Q6H    Continuous Infusions: . sodium chloride    . sodium chloride    . feeding supplement (VITAL 1.5 CAL) 45 mL/hr at 03/20/19 0400  . fentaNYL infusion INTRAVENOUS 200 mcg/hr (03/20/19 0700)  . lactated ringers    . norepinephrine (LEVOPHED) Adult infusion 5 mcg/min (03/20/19 0700)  . vancomycin Stopped (03/19/19 1345)    PRN Meds: sodium chloride, acetaminophen **OR** acetaminophen, fentaNYL (SUBLIMAZE) injection, fentaNYL (SUBLIMAZE) injection, hydrALAZINE, LORazepam,  ondansetron **OR** ondansetron (ZOFRAN) IV, polyethylene glycol, sodium chloride  Physical Exam Vitals signs and nursing note reviewed.  Constitutional:      Interventions: He is sedated and intubated.  HENT:     Head: Normocephalic and atraumatic.  Cardiovascular:     Rate and Rhythm: Tachycardia present.  Pulmonary:     Effort: No tachypnea, accessory muscle usage or respiratory distress. He is intubated.  Abdominal:     General: Bowel sounds are normal.     Tenderness: There is  no abdominal tenderness.  Skin:    Comments: Scattered blisters.             Vital Signs: BP 103/83   Pulse (!) 138   Temp 99 F (37.2 C)   Resp (!) 23   Ht '6\' 3"'  (1.905 m)   Wt 67.3 kg   SpO2 98%   BMI 18.54 kg/m  SpO2: SpO2: 98 % O2 Device: O2 Device: Ventilator O2 Flow Rate: O2 Flow Rate (L/min): 0 L/min  Intake/output summary:   Intake/Output Summary (Last 24 hours) at 03/20/2019 1625 Last data filed at 03/20/2019 1530 Gross per 24 hour  Intake 2775.64 ml  Output 670 ml  Net 2105.64 ml   LBM: Last BM Date: 03/19/19 Baseline Weight: Weight: 75.8 kg Most recent weight: Weight: 67.3 kg       Palliative Assessment/Data: PPS 30%   Flowsheet Rows     Most Recent Value  Intake Tab  Referral Department  Critical care  Unit at Time of Referral  ICU  Palliative Care Primary Diagnosis  Cardiac  Date Notified  03/17/19  Palliative Care Type  New Palliative care  Reason for referral  Clarify Goals of Care, Non-pain Symptom  Date of Admission  03/22/2019  Date first seen by Palliative Care  03/17/19  # of days Palliative referral response time  0 Day(s)  # of days IP prior to Palliative referral  1  Clinical Assessment  Palliative Performance Scale Score  30%  Psychosocial & Spiritual Assessment  Palliative Care Outcomes  Patient/Family meeting held?  Yes  Who was at the meeting?  patient  Palliative Care Outcomes  Clarified goals of care, Provided psychosocial or spiritual support,  ACP counseling assistance      Patient Active Problem List   Diagnosis Date Noted  . Encounter for central line placement   . Acute respiratory failure ([place])   . Severe sepsis with acute organ dysfunction ([place]) 03/17/2019  . C. difficile colitis 03/17/2019  . MRSA bacteremia 03/17/2019  . Palliative care by specialist   . Goals of care, counseling/discussion   . AKI (acute kidney injury) ([place])   . Sepsis ([place]) 03/02/2019  . Pressure injury of skin 03/02/2019    Palliative Care Assessment & Plan   Patient Profile: 63 y.o. male  with past medical history of ETOH use, smoker, hypertension, Marfan syndrome, rheumatoid arthritis admitted on 03/21/2019 with altered mental status and appearing severely malnourished and cachectic. Patient recently hospitalized [facility] between 3/8-3/20/20 after fall found to have aortic dissection and significant dilatation of aortic root. Positive for cdiff. Evaluated by CT surgery with plans for elective aortic surgery, that has been postponed due to Covid 19. In ED, Extensive chronic wounds and blistering lesions on all extremities. ICU admission with severe sepsis with right mid opacity likely pneumonia (? Mass--needs CT chest), left elbow effusion, acute renal failure, malnourishment and failure to thrive. Found to have c.difficile colitis and MRSA bacteremia. ID consultation pending. Palliative medicine consultation for goals of care.   Assessment: Severe sepsis/shock Metabolic acidosis Acute hypoxic and hypercapnic respiratory failure Type B aortic dissection MRSA bacteremia Cdiff diarrhea AKI Hypoalbuminemia  Recommendations/Plan:  Prior to intubation, patient verbalized care team contact his neighbor, Dollene Cleveland, if he was unable to make decisions for himself.   03/19/19 discussed GOC with Ysidro Evert and his daughter, Debe Coder including diagnoses, interventions, and guarded prognosis. Educated on medical recommendation for DNR with underlying frailty,  critical condition, and co-morbidities. Ysidro Evert is not able to make  a decision about code status. Requests time to think about this.  Continue FULL code/FULL scope treatment.  Poor support system. No next-of-kin. Neighbor has only known patient for about 2 years and patient did not discuss EOL wishes with neighbor.  F/u GOC by Dr. Mortimer Fries this AM. Redmond Pulling, Ysidro Evert has not made decision regarding code status/limitations to care. Will need ongoing support.   Code Status: FULL   Code Status Orders  (From admission, onward)         Start     Ordered   03/04/2019 1813  Full code  Continuous     03/20/2019 1812        Code Status History    This patient has a current code status but no historical code status.       Prognosis:  Poor prognosis  Discharge Planning:  To Be Determined  Care plan was discussed during multidisciplinary rounds, Dr. Mortimer Fries  Thank you for allowing the Palliative Medicine Team to assist in the care of this patient.   Time In: 1000 Time Out: 1020 Total Time 20 Prolonged Time Billed  no      Greater than 50%  of this time was spent counseling and coordinating care related to the above assessment and plan.  Ihor Dow, FNP-C Palliative Medicine Team  Phone: [phone] Fax: [phone]  Please contact Palliative Medicine Team phone at [phone] for questions and concerns.

## 2019-03-20 NOTE — Progress Notes (Signed)
Per report, pt had two pivs infusing. Upon assessment, IVs were leaking and removed. One PIV placed by Harlon Ditty, NP. Unable to obtain another PIV d/t edema and patients systolics 70-80s. Central line placed and norepi gtt started.

## 2019-03-21 ENCOUNTER — Inpatient Hospital Stay: Payer: Medicaid Other

## 2019-03-21 DIAGNOSIS — J9600 Acute respiratory failure, unspecified whether with hypoxia or hypercapnia: Secondary | ICD-10-CM

## 2019-03-21 DIAGNOSIS — L97519 Non-pressure chronic ulcer of other part of right foot with unspecified severity: Secondary | ICD-10-CM

## 2019-03-21 DIAGNOSIS — L97529 Non-pressure chronic ulcer of other part of left foot with unspecified severity: Secondary | ICD-10-CM

## 2019-03-21 DIAGNOSIS — L98499 Non-pressure chronic ulcer of skin of other sites with unspecified severity: Secondary | ICD-10-CM

## 2019-03-21 DIAGNOSIS — I712 Thoracic aortic aneurysm, without rupture: Secondary | ICD-10-CM

## 2019-03-21 LAB — BLOOD GAS, ARTERIAL
Acid-base deficit: 3.2 mmol/L — ABNORMAL HIGH (ref 0.0–2.0)
Bicarbonate: 23.6 mmol/L (ref 20.0–28.0)
FIO2: 0.35
MECHVT: 450 mL
O2 Saturation: 91.5 %
PEEP: 5 cmH2O
Patient temperature: 37
RATE: 15 resp/min
pCO2 arterial: 48 mmHg (ref 32.0–48.0)
pH, Arterial: 7.3 — ABNORMAL LOW (ref 7.350–7.450)
pO2, Arterial: 69 mmHg — ABNORMAL LOW (ref 83.0–108.0)

## 2019-03-21 LAB — BASIC METABOLIC PANEL
Anion gap: 8 (ref 5–15)
BUN: 56 mg/dL — ABNORMAL HIGH (ref 8–23)
CO2: 24 mmol/L (ref 22–32)
Calcium: 7.6 mg/dL — ABNORMAL LOW (ref 8.9–10.3)
Chloride: 103 mmol/L (ref 98–111)
Creatinine, Ser: 2.15 mg/dL — ABNORMAL HIGH (ref 0.61–1.24)
GFR calc Af Amer: 37 mL/min — ABNORMAL LOW (ref >60)
GFR calc non Af Amer: 32 mL/min — ABNORMAL LOW (ref >60)
Glucose, Bld: 140 mg/dL — ABNORMAL HIGH (ref 70–99)
Potassium: 4.1 mmol/L (ref 3.5–5.1)
Sodium: 135 mmol/L (ref 135–145)

## 2019-03-21 LAB — CBC WITH DIFFERENTIAL/PLATELET
Abs Immature Granulocytes: 0.1 10*3/uL — ABNORMAL HIGH (ref 0.00–0.07)
Basophils Absolute: 0 10*3/uL (ref 0.0–0.1)
Basophils Relative: 0 %
Eosinophils Absolute: 0.1 10*3/uL (ref 0.0–0.5)
Eosinophils Relative: 1 %
HCT: 25.6 % — ABNORMAL LOW (ref 39.0–52.0)
Hemoglobin: 8.2 g/dL — ABNORMAL LOW (ref 13.0–17.0)
Immature Granulocytes: 1 %
Lymphocytes Relative: 11 %
Lymphs Abs: 1.3 10*3/uL (ref 0.7–4.0)
MCH: 30.9 pg (ref 26.0–34.0)
MCHC: 32 g/dL (ref 30.0–36.0)
MCV: 96.6 fL (ref 80.0–100.0)
Monocytes Absolute: 0.3 10*3/uL (ref 0.1–1.0)
Monocytes Relative: 3 %
Neutro Abs: 9.5 10*3/uL — ABNORMAL HIGH (ref 1.7–7.7)
Neutrophils Relative %: 84 %
Platelets: 74 10*3/uL — ABNORMAL LOW (ref 150–400)
RBC: 2.65 MIL/uL — ABNORMAL LOW (ref 4.22–5.81)
RDW: 15.8 % — ABNORMAL HIGH (ref 11.5–15.5)
WBC: 11.3 10*3/uL — ABNORMAL HIGH (ref 4.0–10.5)
nRBC: 0.5 % — ABNORMAL HIGH (ref 0.0–0.2)

## 2019-03-21 LAB — GLUCOSE, CAPILLARY
Glucose-Capillary: 114 mg/dL — ABNORMAL HIGH (ref 70–99)
Glucose-Capillary: 157 mg/dL — ABNORMAL HIGH (ref 70–99)
Glucose-Capillary: 122 mg/dL — ABNORMAL HIGH (ref 70–99)
Glucose-Capillary: 102 mg/dL — ABNORMAL HIGH (ref 70–99)
Glucose-Capillary: 170 mg/dL — ABNORMAL HIGH (ref 70–99)
Glucose-Capillary: 157 mg/dL — ABNORMAL HIGH (ref 70–99)

## 2019-03-21 LAB — MAGNESIUM: Magnesium: 1.9 mg/dL (ref 1.7–2.4)

## 2019-03-21 MED ORDER — VASOPRESSIN 20 UNIT/ML IV SOLN
0.0300 [IU]/min | INTRAVENOUS | Status: DC
  Administered 2019-03-21 – 2019-03-23 (×3): 0.03 [IU]/min via INTRAVENOUS
  Filled 2019-03-21 (×3): qty 2

## 2019-03-21 MED ORDER — SODIUM CHLORIDE 0.9 % IV SOLN
0.0000 ug/min | INTRAVENOUS | Status: DC
  Administered 2019-03-21 (×2): 90 ug/min via INTRAVENOUS
  Administered 2019-03-21: 20 ug/min via INTRAVENOUS
  Administered 2019-03-22: 65 ug/min via INTRAVENOUS
  Administered 2019-03-22: 85 ug/min via INTRAVENOUS
  Filled 2019-03-21 (×2): qty 4
  Filled 2019-03-21 (×2): qty 40
  Filled 2019-03-21: qty 4

## 2019-03-21 MED ORDER — FAMOTIDINE 20 MG PO TABS
20.0000 mg | ORAL_TABLET | Freq: Two times a day (BID) | ORAL | Status: DC
  Administered 2019-03-21 – 2019-03-22 (×4): 20 mg
  Filled 2019-03-21 (×4): qty 1

## 2019-03-21 MED ORDER — LORAZEPAM 2 MG/ML IJ SOLN
2.0000 mg | INTRAMUSCULAR | Status: DC | PRN
  Administered 2019-03-22 (×2): 2 mg via INTRAVENOUS
  Filled 2019-03-21 (×2): qty 1

## 2019-03-21 MED ORDER — LACTATED RINGERS IV BOLUS: 500.0000 mL | Freq: Once | INTRAVENOUS | Status: DC

## 2019-03-21 MED ORDER — METRONIDAZOLE IN NACL 5-0.79 MG/ML-% IV SOLN
500.0000 mg | Freq: Three times a day (TID) | INTRAVENOUS | Status: DC
  Administered 2019-03-21: 500 mg via INTRAVENOUS
  Filled 2019-03-21 (×3): qty 100

## 2019-03-21 MED ORDER — VITAMIN C 500 MG PO TABS
500.0000 mg | ORAL_TABLET | Freq: Three times a day (TID) | ORAL | Status: DC
  Administered 2019-03-21 – 2019-03-22 (×5): 500 mg
  Filled 2019-03-21 (×5): qty 1

## 2019-03-21 MED ORDER — HYDROCORTISONE NA SUCCINATE PF 100 MG IJ SOLR
50.0000 mg | Freq: Four times a day (QID) | INTRAMUSCULAR | Status: DC
  Administered 2019-03-21 – 2019-03-22 (×8): 50 mg via INTRAVENOUS
  Filled 2019-03-21 (×8): qty 2

## 2019-03-21 MED ORDER — SCOPOLAMINE 1 MG/3DAYS TD PT72
1.0000 | MEDICATED_PATCH | TRANSDERMAL | Status: DC
  Administered 2019-03-21: 1.5 mg via TRANSDERMAL
  Filled 2019-03-21: qty 1

## 2019-03-21 MED ORDER — DEXMEDETOMIDINE HCL IN NACL 400 MCG/100ML IV SOLN
0.4000 ug/kg/h | INTRAVENOUS | Status: DC
  Administered 2019-03-21: 0.6 ug/kg/h via INTRAVENOUS
  Filled 2019-03-21: qty 100

## 2019-03-21 NOTE — Progress Notes (Signed)
Pharmacy Electrolyte Monitoring Consult:  Pharmacy consulted to assist in monitoring and replacing electrolytes in this 63 y.o. male admitted on 02/28/2019 with Altered Mental Status   Labs:  Sodium (mmol/L)  Date Value  03/21/2019 135   Potassium (mmol/L)  Date Value  03/21/2019 4.1   Magnesium (mg/dL)  Date Value  16/07/9603 1.9   Phosphorus (mg/dL)  Date Value  54/06/8118 4.2   Calcium (mg/dL)  Date Value  14/78/2956 7.6 (L)   Albumin (g/dL)  Date Value  21/30/8657 1.7 (L)   Corrected calcium: 9.4  Assessment/Plan: Defer replacement for today.   Will check all electrolytes with am labs.   Will replace for goal potassium ~ 4 and goal magnesium ~ 2.   Pharmacy will continue to monitor and adjust per consult.   Leonard Peters L 03/21/2019 3:25 PM

## 2019-03-21 NOTE — Consult Note (Signed)
[facility] Consult Note  MRN : [id number]  Leonard Peters is a 63 y.o. (October 01, 1956) male who presents with chief complaint of  Chief Complaint  Patient presents with  . Altered Mental Status  .  History of Present Illness: I am asked to see the patient by Dr. 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  The patient is critically ill in the ICU on the ventilator requiring multiple pressors.  He came in with bacteremia and has multiple feet and hand wounds.  He can provide no history and this is obtained from the previous medical record.  He has previously been seen by [facility] vascular and had imaging performed there including a CT scan of the chest with contrast 2 months ago.  He had an uninfused CT scan of the chest here earlier on his admission that I have reviewed.  I could not review the images from the [facility] study (only the report was available)  but I did review the images at our hospital.  The size has not really changed.  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  No evidence of rupture on the uninfused CT scan from a few days ago.  This was done without contrast so evaluation of flow or unstable plaque cannot be determined from that scan.  Current Facility-Administered Medications  Medication Dose Route Frequency Provider Last Rate Last Dose  . 0.9 %  sodium chloride infusion   Intravenous PRN Awilda Bill, NP      . 0.9 %  sodium chloride infusion   Intravenous Continuous End, Christopher, MD   Stopped at 03/20/19 1915  . acetaminophen (TYLENOL) tablet 650 mg  650 mg Oral Q6H PRN Mayo, Pete Pelt, MD       Or  . acetaminophen (TYLENOL) suppository 650 mg  650 mg Rectal Q6H PRN Mayo, Pete Pelt, MD      . chlorhexidine gluconate  (MEDLINE KIT) (PERIDEX) 0.12 % solution 15 mL  15 mL Mouth Rinse BID Flora Lipps, MD   15 mL at 03/21/19 0824  . feeding supplement (PRO-STAT SUGAR FREE 64) liquid 30 mL  30 mL Per Tube BID Flora Lipps, MD   30 mL at 03/21/19 1045  . feeding supplement (VITAL 1.5 CAL) liquid 1,000 mL  1,000 mL Per Tube Continuous Flora Lipps, MD 45 mL/hr at 03/21/19 0500    . fentaNYL (SUBLIMAZE) injection 50-100 mcg  50-100 mcg Intravenous Q2H PRN Darel Hong D, NP   100 mcg at 03/21/19 0300  . fentaNYL 252mg in NS 256m(1027mml) infusion-PREMIX  0-400 mcg/hr Intravenous Continuous KasFlora LippsD 15 mL/hr at 03/21/19 0600 150 mcg/hr at 03/21/19 0600  . FLUoxetine (PROZAC) capsule 40 mg  40 mg Per Tube Daily KasFlora LippsD   40 mg at 03/21/19 1045  . folic acid (FOLVITE) tablet 1 mg  1 mg Per Tube Daily KasFlora LippsD   1 mg at 03/21/19 1045  . heparin injection 5,000 Units  5,000 Units Subcutaneous Q8H KasFlora LippsD   5,000 Units at 03/21/19 0540  . hydrALAZINE (APRESOLINE) injection 10-20 mg  10-20 mg Intravenous Q4H PRN BlaAwilda BillP   20 mg at 03/19/19 [id number] hydrocortisone sodium succinate (SOLU-CORTEF) 100 MG injection 50 mg  50 mg Intravenous Q6H Darel Hong D, NP   50 mg at 03/21/19 9794  . insulin aspart (novoLOG) injection 0-15 Units  0-15 Units Subcutaneous Q4H Darel Hong D, NP   2 Units at 03/21/19 [phone]  . lactated ringers bolus 500 mL  500 mL Intravenous Once Bradly Bienenstock, NP   Stopped at 03/21/19 [phone]  . lactated ringers infusion   Intravenous Continuous Darel Hong D, NP 100 mL/hr at 03/21/19 0736    . LORazepam (ATIVAN) injection 2-3 mg  2-3 mg Intravenous Q1H PRN Flora Lipps, MD      . MEDLINE mouth rinse  15 mL Mouth Rinse 10 times per day Flora Lipps, MD   15 mL at 03/21/19 1048  . multivitamin liquid 15 mL  15 mL Per Tube Daily Flora Lipps, MD   15 mL at 03/21/19 1045  . mupirocin ointment (BACTROBAN) 2 % 1 application  1 application Nasal BID Flora Lipps, MD   1 application at 48/27/07 1046  . norepinephrine (LEVOPHED) 16 mg in dextrose 5 % 250 mL (0.064 mg/mL) infusion  0-40 mcg/min Intravenous Titrated Pyreddy, Pavan, MD 24.4 mL/hr at 03/21/19 0600 26 mcg/min at 03/21/19 0600  . nutrition supplement (JUVEN) (JUVEN) powder packet 1 packet  1 packet Per Tube BID BM Flora Lipps, MD   1 packet at 03/21/19 1045  . ondansetron (ZOFRAN) tablet 4 mg  4 mg Oral Q6H PRN Mayo, Pete Pelt, MD       Or  . ondansetron [facility]) injection 4 mg  4 mg Intravenous Q6H PRN Mayo, Pete Pelt, MD      . pantoprazole (PROTONIX) injection 40 mg  40 mg Intravenous Q24H Anders Simmonds, MD   40 mg at 03/21/19 0540  . phenylephrine (NEO-SYNEPHRINE) 40 mg in sodium chloride 0.9 % 250 mL (0.16 mg/mL) infusion  0-400 mcg/min Intravenous Titrated Darel Hong D, NP 7.5 mL/hr at 03/21/19 0734 20 mcg/min at 03/21/19 0734  . polyethylene glycol (MIRALAX / GLYCOLAX) packet 17 g  17 g Oral Daily PRN Sela Hua, MD   17 g at 03/20/19 0247  . sodium chloride (OCEAN) 0.65 % nasal spray 1 spray  1 spray Each Nare PRN Flora Lipps, MD      . thiamine (VITAMIN B-1) tablet 100 mg  100 mg Per Tube Daily Flora Lipps, MD   100 mg at 03/21/19 1045  . vancomycin (VANCOCIN) 1,500 mg in sodium chloride 0.9 % 500 mL IVPB  1,500 mg Intravenous Q48H Pyreddy, Pavan, MD 250 mL/hr at 03/21/19 1112 1,500 mg at 03/21/19 1112  . vancomycin (VANCOCIN) 50 mg/mL oral solution 500 mg  500 mg Oral Q6H Flora Lipps, MD   500 mg at 03/21/19 1044  . vasopressin (PITRESSIN) 40 Units in sodium chloride 0.9 % 250 mL (0.16 Units/mL) infusion  0.03 Units/min Intravenous Continuous Darel Hong D, NP 11.25 mL/hr at 03/21/19 0600 0.03 Units/min at 03/21/19 0600    History reviewed. No pertinent past medical history.  History reviewed. No pertinent surgical history.  Social History Social History   Tobacco Use  . Smoking status: Current Every Day Smoker  Substance Use Topics  . Alcohol use: Yes     Alcohol/week: 3.0 standard drinks    Types: 3 Cans of beer per week  . Drug use: Yes    Types: Marijuana    Family History No pertinent family history per report. Unable to obtain family history from the patient as he is intubated and critically ill  Allergies  Allergen Reactions  . Lipitor [Atorvastatin Calcium]     Elevated LFTs     REVIEW OF SYSTEMS (Negative unless checked) Unable to obtain review of systems from the patient as he is intubated and critically ill  Physical Examination  Vitals:   03/21/19 0945 03/21/19 1000 03/21/19 1015 03/21/19 1030  BP: 120/64 (!) 110/57 (!) 100/56 (!) 98/55  Pulse: (!) 110 (!) 112 (!) 109 (!) 110  Resp: (!) '21 19 17 19  ' Temp:      TempSrc:      SpO2: 97% 98% 98% 99%  Weight:      Height:       Body mass index is 21.55 kg/m. Gen: Debilitated and chronically ill-appearing.  Appears much older than stated age Head: [place]/AT, + temporalis wasting Ear/Nose/Throat: Unable to assess hearing, nares w/o erythema or drainage, oropharynx w/o Erythema/Exudate Eyes: Sclera non-icteric, conjunctiva clear Neck: Trachea midline.  No JVD.  Pulmonary: Coarse breath sounds on the ventilator. Cardiac: Tachycardic Vascular:  Vessel Right Left  Radial Palpable Palpable                          PT  trace palpable  not palpable  DP  trace palpable  trace palpable   Gastrointestinal: soft, non-tender/non-distended Musculoskeletal: Multiple ulcerations and early gangrenous changes to both hands and both feet.  1-2+ lower extremity edema.  Marked scrotal edema.  Sluggish capillary refill is present in the feet. Neurologic: Sedated on the ventilator so difficult to assess Psychiatric: Unable to obtain as he is sedated on the ventilator Dermatologic: Lesions on the hand and feet associated with blistering and bullae as above      CBC Lab Results  Component Value Date   WBC 11.3 (H) 03/21/2019   HGB 8.2 (L) 03/21/2019   HCT 25.6 (L)  03/21/2019   MCV 96.6 03/21/2019   PLT 74 (L) 03/21/2019    BMET    Component Value Date/Time   NA 135 03/21/2019 0530   K 4.1 03/21/2019 0530   CL 103 03/21/2019 0530   CO2 24 03/21/2019 0530   GLUCOSE 140 (H) 03/21/2019 0530   BUN 56 (H) 03/21/2019 0530   CREATININE 2.15 (H) 03/21/2019 0530   CALCIUM 7.6 (L) 03/21/2019 0530   GFRNONAA 32 (L) 03/21/2019 0530   GFRAA 37 (L) 03/21/2019 0530   Estimated Creatinine Clearance: 38.9 mL/min (A) (by C-G formula based on SCr of 2.15 mg/dL (H)).  COAG Lab Results  Component Value Date   INR 1.1 03/19/2019    Radiology Dg Elbow 2 Views Left  Result Date: 03/19/2019 CLINICAL DATA:  Pain with movement EXAM: LEFT ELBOW - 2 VIEW COMPARISON:  None. FINDINGS: The joint effusion is identified with displacement of the anterior fat pad. No fractures is identified. No bony erosion or bony lesion. IMPRESSION: Joint effusion of uncertain etiology.  No bony abnormality noted. Electronically Signed   By: Dorise Bullion III M.D   On: 03/20/2019 13:00   Dg Elbow 2 Views Right  Result Date: 03/24/2019 CLINICAL DATA:  Possible sepsis. Shortness of breath. Numerous festering wounds. Open wound posterior right elbow and at medial surface of right ankle. Pain in left elbow with movement. EXAM: RIGHT ELBOW - 2 VIEW COMPARISON:  None. FINDINGS: There is a wound over the olecranon. There is mild sclerosis in the underlying olecranon without identified bony erosion. There is suggestion of a possible small joint effusion. No fractures. IMPRESSION: 1. There is  a deep wound posterior to the olecranon. Sclerosis in the underlying olecranon is identified without bony erosion. The sclerosis could represent sequela of previous osteomyelitis. An MRI could further assess if clinically warranted. 2. Suspected small joint effusion. 3. No fractures. Electronically Signed   By: Dorise Bullion III M.D   On: 03/24/2019 12:56   Dg Ankle 2 Views Right  Result Date:  03/07/2019 CLINICAL DATA:  Evaluate for sepsis. EXAM: RIGHT ANKLE - 2 VIEW COMPARISON:  None. FINDINGS: No fracture or dislocation. The ankle mortise is intact. Reported skin defect over the medial surface of the right ankle is not visualized on this study. No bony erosion or evidence of osteomyelitis. IMPRESSION: No evidence of osteomyelitis. Electronically Signed   By: Dorise Bullion III M.D   On: 03/09/2019 12:59   Dg Abd 1 View  Result Date: 03/18/2019 CLINICAL DATA:  Nasogastric tube placement. EXAM: ABDOMEN - 1 VIEW COMPARISON:  Radiographs Mar 17, 2019. FINDINGS: Mildly dilated small bowel loops are noted in the left side of the abdomen which may represent ileus or possibly distal small bowel obstruction. Distal tip of nasogastric tube is seen in expected position of proximal stomach. Side hole appears to be just above gastroesophageal junction. IMPRESSION: Distal tip of nasogastric tube appears to be in proximal stomach with side hole just above gastroesophageal junction; advancement is recommended. Mildly dilated small bowel loops are noted concerning for ileus or possibly distal small bowel obstruction. Electronically Signed   By: Marijo Conception M.D.   On: 03/18/2019 08:10   Ct Head Wo Contrast  Result Date: 03/20/2019 CLINICAL DATA:  Lung cancer staging EXAM: CT HEAD WITHOUT CONTRAST TECHNIQUE: Contiguous axial images were obtained from the base of the skull through the vertex without intravenous contrast. COMPARISON:  None. FINDINGS: Brain: There is no mass, hemorrhage or extra-axial collection. The size and configuration of the ventricles and extra-axial CSF spaces are normal. There is hypoattenuation of the white matter, most commonly indicating chronic small vessel disease. Vascular: No abnormal hyperdensity of the major intracranial arteries or dural venous sinuses. No intracranial atherosclerosis. Skull: The visualized skull base, calvarium and extracranial soft tissues are normal.  Sinuses/Orbits: No fluid levels or advanced mucosal thickening of the visualized paranasal sinuses. No mastoid or middle ear effusion. The orbits are normal. IMPRESSION: Mild chronic white matter small vessel ischemic changes without acute abnormality. No evidence for intracranial metastatic disease on this noncontrast examination. Electronically Signed   By: Ulyses Jarred M.D.   On: 03/20/2019 15:49   Ct Head Wo Contrast  Result Date: 03/09/2019 CLINICAL DATA:  63 year old with unexplained acute mental status changes as the patient is unable to speak clearly. Patient also presents with respiratory distress and has widespread abrasions. EXAM: CT HEAD WITHOUT CONTRAST TECHNIQUE: Contiguous axial images were obtained from the base of the skull through the vertex without intravenous contrast. COMPARISON:  None. FINDINGS: Brain: Ventricular system normal in size and appearance for age. Very small old lacunar stroke in the LEFT thalamus. Mild changes of small vessel disease of the white matter diffusely. No mass lesion. No midline shift. No acute hemorrhage or hematoma. No extra-axial fluid collections. No evidence of acute infarction. Vascular: Mild BILATERAL vertebral artery atherosclerosis. No hyperdense vessel. Skull: No skull fracture or other focal osseous abnormality involving the skull. Sinuses/Orbits: Mucous retention cyst or polyp in the RIGHT maxillary sinus. Remaining visualized paranasal sinuses, BILATERAL mastoid air cells and BILATERAL middle ear cavities well-aerated. Visualized orbits and globes unremarkable. Other: None. IMPRESSION:  1. No acute intracranial abnormality. 2. Mild chronic microvascular ischemic changes of the white matter and very small old lacunar stroke in the left thalamus. 3. Mild chronic right maxillary sinus disease. Electronically Signed   By: Evangeline Dakin M.D.   On: 03/12/2019 13:35   Ct Chest Wo Contrast  Result Date: 03/20/2019 CLINICAL DATA:  Ventilator dependence.   Decreased breath sounds. EXAM: CT CHEST WITHOUT CONTRAST TECHNIQUE: Multidetector CT imaging of the chest was performed following the standard protocol without IV contrast. COMPARISON:  03/19/2019 chest x-ray FINDINGS: Cardiovascular: The heart size is normal. No substantial pericardial effusion. Coronary artery calcification is evident. Ascending thoracic aorta measures up to 5.5 cm diameter. Atherosclerotic calcification is noted in the wall of the thoracic aorta. Right central line tip is positioned in the mid SVC. Mediastinum/Nodes: Endotracheal tube tip visualized in the upper trachea. 11 mm short axis right paratracheal lymph node is mildly enlarged. Hilar regions not well evaluated for lymphadenopathy given collapse/consolidative changes in the parahilar lungs. The esophagus has normal imaging features. There is no axillary lymphadenopathy. Lungs/Pleura: Interstitial and patchy airspace disease is identified in the posterior right upper lobe and right middle lobe with collapse/consolidation of the right lower lobe. Similar mild interstitial and patchy airspace opacity noted posterior left upper lobe with collapse/consolidative change in the left lower lobe. Small to moderate pleural effusions are noted bilaterally. Upper Abdomen: Perihepatic ascites noted in the upper abdomen. Stomach is distended with fluid. Aorta measures 5.2 cm in diameter at the level of the diaphragmatic hiatus. Musculoskeletal: No worrisome lytic or sclerotic osseous abnormality. Multiple nonacute right rib fractures are evident with possible acute nondisplaced fracture of the lateral right fifth rib (91/2). Mild superior endplate compression deformity noted at T5. Patient is status post cervicothoracic fusion. IMPRESSION: 1. Bilateral lower lobe collapse/consolidation with small to moderate bilateral pleural effusions. Interstitial and patchy airspace disease noted in both upper lobes. Imaging features compatible with multifocal  pneumonia. 2. Mild right paratracheal lymphadenopathy is likely reactive. 3. Ascending thoracic aorta measures 5.5 cm diameter. Descending aorta measures 5.2 cm diameter at the level of the diaphragmatic hiatus. Recommend semi-annual imaging followup by CTA or MRA and referral to cardiothoracic surgery if not already obtained. This recommendation follows 2010 ACCF/AHA/AATS/ACR/ASA/SCA/SCAI/SIR/STS/SVM Guidelines for the Diagnosis and Management of Patients With Thoracic Aortic Disease. Circulation. 2010; 121: E266-e369TAA. Aortic aneurysm NOS (ICD10-I71.9) Electronically Signed   By: Misty Stanley M.D.   On: 03/20/2019 16:10   US Renal  Result Date: 03/17/2019 CLINICAL DATA:  Acute kidney injury EXAM: RENAL / URINARY TRACT ULTRASOUND COMPLETE COMPARISON:  None. FINDINGS: Right Kidney: Renal measurements: 10.6 x 4.7 x 4.4 cm = volume: 114 mL . Echogenicity within normal limits. No mass or hydronephrosis visualized. 15 mm right upper pole cyst. Left Kidney: Renal measurements: 9.9 x 5.3 by 4.7 cm = volume: 127 mL. Echogenicity within normal limits. No mass or hydronephrosis visualized. Bladder: Diffuse bladder wall thickening, mild. Ascites around the liver IMPRESSION: Negative for renal mass or obstruction.  Bladder wall thickening Ascites Electronically Signed   By: Franchot Gallo M.D.   On: 03/17/2019 13:44   Mr Elbow Right Wo Contrast  Result Date: 03/17/2019 CLINICAL DATA:  Wound overlying the olecranon process. Evaluate for osteomyelitis. EXAM: MRI OF THE RIGHT ELBOW WITHOUT CONTRAST TECHNIQUE: Multiplanar, multisequence MR imaging of the elbow was performed. No intravenous contrast was administered. COMPARISON:  None. FINDINGS: TENDONS Common forearm flexor origin: Intact. Common forearm extensor origin: Mild tendinosis of the common extensor tendon origin.  Biceps: Intact. Triceps: Intact. LIGAMENTS Medial stabilizers: Intact. Lateral stabilizers:  Intact. Cartilage: Small focal area of cartilage loss  involving the ulnar aspect of the radial head with subchondral reactive marrow changes. Joint: Large elbow joint effusion. Cubital tunnel: Normal. Bones: Large soft tissue wound overlying the olecranon process extending to the cortex. Single focal area of cortical irregularity and subcortical marrow edema involving the posterior olecranon on (image 14/series 7) concerning for an area of reactive marrow edema versus mild early osteomyelitis. 9.2 x 15 mm fluid collection along the posterior superficial aspect of the triceps tendon which extends through the triceps tendon into the posterior fat pad with edema throughout the posterior fat pad. IMPRESSION: 1. Large soft tissue wound overlying the olecranon process extending to the cortex. Single focal area of cortical irregularity and subcortical marrow edema involving the posterior olecranon on (image 14/series 7) concerning for an area of reactive marrow edema versus mild early osteomyelitis. 9.2 x 15 mm fluid collection along the posterior superficial aspect of the triceps tendon which extends through the triceps tendon into the posterior fat pad with edema throughout the posterior fat pad concerning for an abscess. Large elbow joint effusion which may be reactive, but is most concerning for septic arthritis. Electronically Signed   By: Kathreen Devoid   On: 03/17/2019 13:50   Dg Chest Port 1 View  Result Date: 03/20/2019 CLINICAL DATA:  63 year old male central line placement. EXAM: PORTABLE CHEST 1 VIEW COMPARISON:  0302 hours the same day and earlier. FINDINGS: Portable AP supine view at 2342 hours. Endotracheal tube tip at the level the clavicles. Enteric tube courses to the abdomen, tip not included. New right IJ central line in place, tip at or just below the level of the carina. No pneumothorax. Stable lung volumes. Stable mediastinal contours allowing for differences in rotation. Veiling opacity at the right lung base has increased since 03/10/2019. Pulmonary  vascularity has increased. Extensive previous posterior cervical and upper thoracic spine fusion. IMPRESSION: 1. Right IJ central line placed, tip at the SVC level. No pneumothorax. 2. ETT tip at the level the clavicles. Enteric tube courses to the abdomen. 3. Suspect increasing right pleural effusion since 03/29/2019, and borderline to mild interstitial edema. Electronically Signed   By: Genevie Ann M.D.   On: 03/20/2019 00:08   Dg Chest Port 1 View  Result Date: 03/19/2019 CLINICAL DATA:  63 y/o  M; acute respiratory failure. EXAM: PORTABLE CHEST 1 VIEW COMPARISON:  03/18/2019 chest radiograph. FINDINGS: Stable cardiac silhouette and uncoiled aorta given oblique technique. Endotracheal tube tip projects 6.5 cm above the carina. Enteric tube tip extends below the field of view into the abdomen. Increasing basilar infiltrates greater on the left. Possible small effusions. No pneumothorax. Cervical fusion hardware, partially visualized. No acute osseous abnormality is evident. IMPRESSION: Stable support apparatus. Increasing basilar infiltrates greater on the left. Possible small effusions. Electronically Signed   By: Kristine Garbe M.D.   On: 03/19/2019 03:58   Dg Chest Port 1 View  Result Date: 03/18/2019 CLINICAL DATA:  Status post intubation. EXAM: PORTABLE CHEST 1 VIEW COMPARISON:  Radiograph of Mar 16, 2019. FINDINGS: Stable cardiomediastinal silhouette. Endotracheal and nasogastric tubes are in grossly good position. No pneumothorax is noted. Left lung is clear. Minimal right basilar subsegmental atelectasis or infiltrate is noted. Bony thorax is unremarkable. IMPRESSION: Endotracheal and nasogastric tubes are in grossly good position. Minimal right basilar subsegmental atelectasis or infiltrate. Electronically Signed   By: Bobbe Medico.D.  On: 03/18/2019 08:09   Dg Chest Port 1 View  Result Date: 03/22/2019 CLINICAL DATA:  Sepsis. EXAM: PORTABLE CHEST 1 VIEW COMPARISON:  None FINDINGS:  The thoracic aorta is prominent tortuous. The heart, hila, and mediastinum are unremarkable. The left lung is clear. Mild opacity in the right mid lung. Mild atelectasis in the right base. No other acute abnormalities. IMPRESSION: 1. Possible mild opacity in the right mid lung could represent subtle pneumonia. A better volume PA and lateral chest x-ray may better evaluate. 2. Prominent tortuous thoracic aorta. Electronically Signed   By: Dorise Bullion III M.D   On: 03/12/2019 12:58   Dg Abd Portable 1v  Result Date: 03/19/2019 CLINICAL DATA:  63 year old male tube placement. EXAM: PORTABLE ABDOMEN - 1 VIEW COMPARISON:  1535 hours today. FINDINGS: Portable AP semi upright view at 1749 hours. The NG tube has been removed and enteric feeding tube has been placed. The tip projects in the medial lower abdomen, likely within the distal aspect of a ptotic stomach. There probably is adequate slack to allow for post pyloric transit. Otherwise stable abdomen and visible lower chest. IMPRESSION: Feeding tube placed, tip likely in the distal aspect of a ptotic stomach. Electronically Signed   By: Genevie Ann M.D.   On: 03/19/2019 19:59   Dg Abd Portable 1v  Result Date: 03/19/2019 CLINICAL DATA:  OG tube placement EXAM: PORTABLE ABDOMEN - 1 VIEW COMPARISON:  Earlier same day; 03/18/2019 FINDINGS: Interval advancement of enteric tube with side port now projected the expected location of the GE junction, similar to the 02/2018 examination. Gaseous distention of the colon and cecum. No pneumoperitoneum, pneumatosis or portal venous gas. Limited visualization of the lower thorax demonstrates suspected trace bilateral effusions with associated bibasilar opacities. Mild scoliotic curvature of the thoracolumbar spine, potentially positional. IMPRESSION: Interval advancement of enteric tube with side port now projected over the expected location of the GE junction. Advancement an additional 8 cm could be performed as indicated.  Electronically Signed   By: Sandi Mariscal M.D.   On: 03/19/2019 16:11   Dg Abd Portable 1v  Result Date: 03/19/2019 CLINICAL DATA:  Ileus. EXAM: PORTABLE ABDOMEN - 1 VIEW COMPARISON:  03/18/2019 FINDINGS: There is gaseous distention of multiple loops of small bowel and colon. There is a relative paucity of bowel gas at the level of the rectum. The nasogastric tube is suboptimally positioned in the esophagus. Bibasilar airspace opacities are noted IMPRESSION: 1. Nasogastric tube in the esophagus.  Repositioning is recommended. 2. Persistent mildly dilated loops of small bowel. 3. Bibasilar airspace opacities. Consider dedicated chest radiograph for further evaluation. Electronically Signed   By: Constance Holster M.D.   On: 03/19/2019 13:41   Dg Abd Portable 1v  Result Date: 03/17/2019 CLINICAL DATA:  Screening for metallic foreign bodies prior to MRI EXAM: PORTABLE ABDOMEN - 1 VIEW COMPARISON:  None. FINDINGS: The bowel gas pattern is normal. No radio-opaque calculi or other significant radiographic abnormality are seen. No definitive metallic foreign body is noted. Degenerative changes of lumbar spine are noted. IMPRESSION: No definitive metallic foreign body is identified. Electronically Signed   By: Inez Catalina M.D.   On: 03/17/2019 10:08      Assessment/Plan 1.  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  His thoracoabdominal aneurysm certainly could be a source of peripheral embolization, but this would require an extensive cardiothoracic repair including cardiopulmonary bypass due to the location and nature of the aneurysm beginning at the aortic root.  That is not an option at our institution, and quite honestly is not an  option for a patient in this critical condition.  He was previously seen at [facility] and apparently there was consideration for repair, but I do not know all those details.  I could not review the images from the [facility] study (only the report was available)  but I did review the images at our hospital.  The size has not really changed.  At this point, I really have little to offer.  If he stabilizes would consider referral to a tertiary center for consideration for repair at some point in the future. 2.  Sepsis.  Requiring multiple vasopressors.  Certainly worsening his peripheral perfusion and making the areas on the feet and toes worse.  Very difficult situation. 3.  Respiratory failure.  On the ventilator.  Failed his SBT.  Still requiring ventilator support. 4.  Bacteremia.  Being treated with intravenous antibiotics.  Likely the source of his sepsis and if he is having peripheral embolization with bacteremic blood, the reason for the multiple extremity wounds. 5.  DTs.  This is a significant comorbidity and worsens his chances of survival.    Leotis Pain, MD  03/21/2019 11:13 AM    This note was created with Dragon medical transcription system.  Any error is purely unintentional

## 2019-03-21 NOTE — Progress Notes (Addendum)
CRITICAL CARE NOTE  CC  follow up respiratory failure Severe septic shock  SUBJECTIVE Patient remains critically ill Prognosis is guarded On multiple vasopressors Failed SAT/SBT Severe septic shock and severe resp  failure   BP (!) 79/42   Pulse (!) 127   Temp 99.7 F (37.6 C) (Axillary)   Resp 18   Ht 6\' 3"  (1.905 m)   Wt 78.2 kg   SpO2 95%   BMI 21.55 kg/m    I/O last 3 completed shifts: In: 4164.7 [I.V.:2877.7; NG/GT:1287] Out: 400 [Urine:310; Drains:40; Stool:50] No intake/output data recorded.  SpO2: 95 % O2 Flow Rate (L/min): 0 L/min FiO2 (%): 40 %   SIGNIFICANT EVENTS 5/17 admitted for sepsis 5/18 C diff POS, MRSA +bacteremia, MRI c/w RT elbow OSTEO 5/19 DT's and Severe resp failure, ETT 7.5, aspiration pneumonia 5/20 TEE negative 5/21 on vent and pressors, failed SAT/SBT CT chest RLL pneumonia,  5/21 CVL placed 5/22 worsening shock, multiple vasopressors  REVIEW OF SYSTEMS  PATIENT IS UNABLE TO PROVIDE COMPLETE REVIEW OF SYSTEMS DUE TO SEVERE CRITICAL ILLNESS   PHYSICAL EXAMINATION:  GENERAL:critically ill appearing, +resp distress HEAD: Normocephalic, atraumatic.  EYES: Pupils equal, round, reactive to light.  No scleral icterus.  MOUTH: Moist mucosal membrane. NECK: Supple. No thyromegaly. No nodules. No JVD.  PULMONARY: +rhonchi, +wheezing CARDIOVASCULAR: S1 and S2. Regular rate and rhythm. No murmurs, rubs, or gallops.  GASTROINTESTINAL: Soft, nontender, -distended. No masses. Positive bowel sounds. No hepatosplenomegaly.  MUSCULOSKELETAL: No swelling, clubbing, or edema.  NEUROLOGIC: obtunded, GCS<8 SKIN:intact,warm,dry  MEDICATIONS: I have reviewed all medications and confirmed regimen as documented Antimicrobials this admission: Cefepime 5/17 x 1 Metronidazole 5/17, 5/18 >>  Vancomycin IV 5/17 >>  Vancomycin PO 5/18 >> 5/18, PR 5/18 >>  Ceftriaxone 5/18 x 1  Doxycycline 5/18 x 1   Dose adjustments this admission: 5/18 Vancomycin  PO adjusted to 500mg  PO Q6hr.  5/19 Vancomycin PO adjusted to PR  5/20 Vanc IV 1000 mg q48h >> IV 1500 mg q48h  Microbiology results: 5/17 BCx: 4/4 bottles MRSA,  5/17 UCx: >100K MRSA  5/17 MRSA PCR: positive  5/17 COVID: negative  5/18 Clostridium Difficile: positive 5/19 BCx: sent    CULTURE RESULTS   Recent Results (from the past 240 hour(s))  Blood Culture (routine x 2)     Status: Abnormal   Collection Time: 09/18/19 12:16 PM  Result Value Ref Range Status   Specimen Description   Final    BLOOD BLOOD RIGHT FOREARM Performed at [facility], [address] [place], [place] [address]    Special Requests   Final    BOTTLES DRAWN AEROBIC AND ANAEROBIC Blood Culture results may not be optimal due to an excessive volume of blood received in culture bottles Performed at [facility], [address] [place], [place] [address]    Culture  Setup Time   Final    GRAM POSITIVE COCCI IN BOTH AEROBIC AND ANAEROBIC BOTTLES CRITICAL RESULT CALLED TO, READ BACK BY AND VERIFIED WITH: JASON ROBBINS ON 03/17/2019 AT 0012 QSD Performed at [facility], [address] [place], [place] [address]    Culture (A)  Final    STAPHYLOCOCCUS AUREUS SUSCEPTIBILITIES PERFORMED ON PREVIOUS CULTURE WITHIN THE LAST 5 DAYS. Performed at [facility], [address] [place], [place] [address]    Report Status 03/19/2019 FINAL  Final  Blood Culture (routine x 2)     Status: Abnormal   Collection Time: 09/18/19 12:16 PM  Result Value Ref Range Status   Specimen Description   Final    BLOOD LAC Performed at [facility], [address] [place], [place] [address]    Special Requests   Final    BOTTLES DRAWN AEROBIC AND ANAEROBIC Blood Culture results may not be optimal due to an excessive volume of blood received in culture bottles Performed at [facility], [address], [place], [place] [address]    Culture  Setup Time   Final    GRAM  POSITIVE COCCI IN BOTH AEROBIC AND ANAEROBIC BOTTLES CRITICAL RESULT CALLED TO, READ BACK BY AND VERIFIED WITH: JASON ROBBINS ON 03/17/2019 AT 0012 QSD Performed at [facility], [address] [place], [place] [address]    Culture METHICILLIN RESISTANT STAPHYLOCOCCUS AUREUS (A)  Final   Report Status 03/19/2019 FINAL  Final   Organism ID, Bacteria METHICILLIN RESISTANT STAPHYLOCOCCUS AUREUS  Final      Susceptibility   Methicillin resistant staphylococcus aureus - MIC*    CIPROFLOXACIN <=0.5 SENSITIVE Sensitive     ERYTHROMYCIN <=0.25 SENSITIVE Sensitive     GENTAMICIN <=0.5 SENSITIVE Sensitive     OXACILLIN >=4 RESISTANT Resistant     TETRACYCLINE <=1 SENSITIVE Sensitive     VANCOMYCIN 1 SENSITIVE Sensitive     TRIMETH/SULFA <=10 SENSITIVE Sensitive     CLINDAMYCIN <=0.25 SENSITIVE Sensitive     RIFAMPIN <=0.5 SENSITIVE Sensitive     Inducible Clindamycin NEGATIVE Sensitive     * METHICILLIN RESISTANT STAPHYLOCOCCUS AUREUS  Urine culture     Status: Abnormal   Collection Time: 04-06-19 12:16 PM  Result Value Ref Range Status   Specimen Description   Final    URINE, RANDOM Performed at [facility], [address] [place], [place] [address]    Special Requests   Final    NONE Performed at [facility], [address] [place], [place] [address]    Culture (A)  Final    >=100,000 COLONIES/mL METHICILLIN RESISTANT STAPHYLOCOCCUS AUREUS   Report Status 03/18/2019 FINAL  Final   Organism ID, Bacteria METHICILLIN RESISTANT STAPHYLOCOCCUS AUREUS (A)  Final      Susceptibility   Methicillin resistant staphylococcus aureus - MIC*    CIPROFLOXACIN <=0.5 SENSITIVE Sensitive     GENTAMICIN <=0.5 SENSITIVE Sensitive     NITROFURANTOIN 32 SENSITIVE Sensitive     OXACILLIN >=4 RESISTANT Resistant     TETRACYCLINE <=1 SENSITIVE Sensitive     VANCOMYCIN 1 SENSITIVE Sensitive     TRIMETH/SULFA <=10 SENSITIVE Sensitive     CLINDAMYCIN <=0.25 SENSITIVE Sensitive      RIFAMPIN <=0.5 SENSITIVE Sensitive     Inducible Clindamycin NEGATIVE Sensitive     * >=100,000 COLONIES/mL METHICILLIN RESISTANT STAPHYLOCOCCUS AUREUS  SARS Coronavirus 2 (CEPHEID- Performed in [facility] hospital lab), Hosp Order     Status: None   Collection Time: 04/06/2019 12:16 PM  Result Value Ref Range Status   SARS Coronavirus 2 NEGATIVE NEGATIVE Final    Comment: (NOTE) If result is NEGATIVE SARS-CoV-2 target nucleic acids are NOT DETECTED. The SARS-CoV-2 RNA is generally detectable in upper and lower  respiratory specimens during the acute phase of infection. The lowest  concentration of SARS-CoV-2 viral copies this assay can detect is 250  copies / mL. A negative result does not preclude SARS-CoV-2 infection  and should not be used as the sole basis for treatment or other  patient management decisions.  A negative result may occur with  improper specimen  collection / handling, submission of specimen other  than nasopharyngeal swab, presence of viral mutation(s) within the  areas targeted by this assay, and inadequate number of viral copies  (<250 copies / mL). A negative result must be combined with clinical  observations, patient history, and epidemiological information. If result is POSITIVE SARS-CoV-2 target nucleic acids are DETECTED. The SARS-CoV-2 RNA is generally detectable in upper and lower  respiratory specimens dur ing the acute phase of infection.  Positive  results are indicative of active infection with SARS-CoV-2.  Clinical  correlation with patient history and other diagnostic information is  necessary to determine patient infection status.  Positive results do  not rule out bacterial infection or co-infection with other viruses. If result is PRESUMPTIVE POSTIVE SARS-CoV-2 nucleic acids MAY BE PRESENT.   A presumptive positive result was obtained on the submitted specimen  and confirmed on repeat testing.  While 2019 novel coronavirus  (SARS-CoV-2)  nucleic acids may be present in the submitted sample  additional confirmatory testing may be necessary for epidemiological  and / or clinical management purposes  to differentiate between  SARS-CoV-2 and other Sarbecovirus currently known to infect humans.  If clinically indicated additional testing with an alternate test  methodology [phone]([id number]) is advised. The SARS-CoV-2 RNA is generally  detectable in upper and lower respiratory sp ecimens during the acute  phase of infection. The expected result is Negative. Fact Sheet for Patients:  BoilerBrush.com.cyhttps://www.fda.gov/media/136312/download Fact Sheet for Healthcare Providers: https://pope.com/https://www.fda.gov/media/136313/download This test is not yet approved or cleared by the [place] FDA and has been authorized for detection and/or diagnosis of SARS-CoV-2 by FDA under an Emergency Use Authorization (EUA).  This EUA will remain in effect (meaning this test can be used) for the duration of the COVID-19 declaration under Section 564(b)(1) of the Act, 21 U.S.C. section 360bbb-3(b)(1), unless the authorization is terminated or revoked sooner. Performed at [facility], [address] [place], [place] [address]   Blood Culture ID Panel (Reflexed)     Status: Abnormal   Collection Time: 05/11/2019 12:16 PM  Result Value Ref Range Status   Enterococcus species NOT DETECTED NOT DETECTED Final   Listeria monocytogenes NOT DETECTED NOT DETECTED Final   Staphylococcus species DETECTED (A) NOT DETECTED Final    Comment: CRITICAL RESULT CALLED TO, READ BACK BY AND VERIFIED WITH: JASON ROBBINS ON 03/17/2019 AT 0012 QSD    Staphylococcus aureus (BCID) DETECTED (A) NOT DETECTED Final    Comment: Methicillin (oxacillin)-resistant Staphylococcus aureus (MRSA). MRSA is predictably resistant to beta-lactam antibiotics (except ceftaroline). Preferred therapy is vancomycin unless clinically contraindicated. Patient requires contact precautions if   hospitalized. CRITICAL RESULT CALLED TO, READ BACK BY AND VERIFIED WITH: JASON ROBBINS ON 03/17/2019 AT 0012 QSD    Methicillin resistance DETECTED (A) NOT DETECTED Final    Comment: CRITICAL RESULT CALLED TO, READ BACK BY AND VERIFIED WITH: JASON ROBBINS ON 03/17/2019 AT 0012 QSD    Streptococcus species NOT DETECTED NOT DETECTED Final   Streptococcus agalactiae NOT DETECTED NOT DETECTED Final   Streptococcus pneumoniae NOT DETECTED NOT DETECTED Final   Streptococcus pyogenes NOT DETECTED NOT DETECTED Final   Acinetobacter baumannii NOT DETECTED NOT DETECTED Final   Enterobacteriaceae species NOT DETECTED NOT DETECTED Final   Enterobacter cloacae complex NOT DETECTED NOT DETECTED Final   Escherichia coli NOT DETECTED NOT DETECTED Final   Klebsiella oxytoca NOT DETECTED NOT DETECTED Final   Klebsiella pneumoniae NOT DETECTED NOT DETECTED Final   Proteus species NOT DETECTED NOT DETECTED  Final   Serratia marcescens NOT DETECTED NOT DETECTED Final   Haemophilus influenzae NOT DETECTED NOT DETECTED Final   Neisseria meningitidis NOT DETECTED NOT DETECTED Final   Pseudomonas aeruginosa NOT DETECTED NOT DETECTED Final   Candida albicans NOT DETECTED NOT DETECTED Final   Candida glabrata NOT DETECTED NOT DETECTED Final   Candida krusei NOT DETECTED NOT DETECTED Final   Candida parapsilosis NOT DETECTED NOT DETECTED Final   Candida tropicalis NOT DETECTED NOT DETECTED Final    Comment: Performed at [facility], [address] [place], [place] [address]  MRSA PCR Screening     Status: Abnormal   Collection Time: 03/29/2019  6:16 PM  Result Value Ref Range Status   MRSA by PCR POSITIVE (A) NEGATIVE Final    Comment:        The GeneXpert MRSA Assay (FDA approved for NASAL specimens only), is one component of a comprehensive MRSA colonization surveillance program. It is not intended to diagnose MRSA infection nor to guide or monitor treatment for MRSA infections. RESULT  CALLED TO, READ BACK BY AND VERIFIED WITH: PREDOME,F AT 2001 ON 03/11/2019 BY MOSLEY,J Performed at [facility], [address] [place], [place] [address]   C difficile quick scan w PCR reflex     Status: Abnormal   Collection Time: 03/17/19  4:45 AM  Result Value Ref Range Status   C Diff antigen POSITIVE (A) NEGATIVE Final   C Diff toxin POSITIVE (A) NEGATIVE Corrected    Comment: CRITICAL RESULT CALLED TO, READ BACK BY AND VERIFIED WITH: FELICIA PREUDHOMME  03/17/19 [facility] CORRECTED ON 05/19 AT 0853: PREVIOUSLY REPORTED AS POSITIVE RESULT CALLED TO, READ BACK BY AND VERIFIED WITH: FELICIA PREUDHOMME  03/17/19 AKT    C Diff interpretation Toxin producing C. difficile detected.  Final    Comment: Performed at [facility], [address] [place], [place] [address]  CULTURE, BLOOD (ROUTINE X 2) w Reflex to ID Panel     Status: None (Preliminary result)   Collection Time: 03/18/19 10:38 AM  Result Value Ref Range Status   Specimen Description BLOOD RIGHT HAND  Final   Special Requests   Final    BOTTLES DRAWN AEROBIC AND ANAEROBIC Blood Culture adequate volume   Culture   Final    NO GROWTH 3 DAYS Performed at [facility], [address], [place], [place] [address]    Report Status PENDING  Incomplete  CULTURE, BLOOD (ROUTINE X 2) w Reflex to ID Panel     Status: None (Preliminary result)   Collection Time: 03/18/19 10:38 AM  Result Value Ref Range Status   Specimen Description BLOOD RIGHT WRIST  Final   Special Requests   Final    BOTTLES DRAWN AEROBIC AND ANAEROBIC Blood Culture adequate volume   Culture   Final    NO GROWTH 3 DAYS Performed at [facility], [address], [place], [place] [address]    Report Status PENDING  Incomplete          IMAGING    Ct Head Wo Contrast  Result Date: 03/20/2019 CLINICAL DATA:  Lung cancer staging EXAM: CT HEAD WITHOUT CONTRAST TECHNIQUE: Contiguous axial images were obtained from  the base of the skull through the vertex without intravenous contrast. COMPARISON:  None. FINDINGS: Brain: There is no mass, hemorrhage or extra-axial collection. The size and configuration of the ventricles and extra-axial CSF spaces are normal. There is hypoattenuation of the white matter, most commonly indicating chronic  small vessel disease. Vascular: No abnormal hyperdensity of the major intracranial arteries or dural venous sinuses. No intracranial atherosclerosis. Skull: The visualized skull base, calvarium and extracranial soft tissues are normal. Sinuses/Orbits: No fluid levels or advanced mucosal thickening of the visualized paranasal sinuses. No mastoid or middle ear effusion. The orbits are normal. IMPRESSION: Mild chronic white matter small vessel ischemic changes without acute abnormality. No evidence for intracranial metastatic disease on this noncontrast examination. Electronically Signed   By: Deatra Robinson M.D.   On: 03/20/2019 15:49   Ct Chest Wo Contrast  Result Date: 03/20/2019 CLINICAL DATA:  Ventilator dependence.  Decreased breath sounds. EXAM: CT CHEST WITHOUT CONTRAST TECHNIQUE: Multidetector CT imaging of the chest was performed following the standard protocol without IV contrast. COMPARISON:  03/19/2019 chest x-ray FINDINGS: Cardiovascular: The heart size is normal. No substantial pericardial effusion. Coronary artery calcification is evident. Ascending thoracic aorta measures up to 5.5 cm diameter. Atherosclerotic calcification is noted in the wall of the thoracic aorta. Right central line tip is positioned in the mid SVC. Mediastinum/Nodes: Endotracheal tube tip visualized in the upper trachea. 11 mm short axis right paratracheal lymph node is mildly enlarged. Hilar regions not well evaluated for lymphadenopathy given collapse/consolidative changes in the parahilar lungs. The esophagus has normal imaging features. There is no axillary lymphadenopathy. Lungs/Pleura: Interstitial and  patchy airspace disease is identified in the posterior right upper lobe and right middle lobe with collapse/consolidation of the right lower lobe. Similar mild interstitial and patchy airspace opacity noted posterior left upper lobe with collapse/consolidative change in the left lower lobe. Small to moderate pleural effusions are noted bilaterally. Upper Abdomen: Perihepatic ascites noted in the upper abdomen. Stomach is distended with fluid. Aorta measures 5.2 cm in diameter at the level of the diaphragmatic hiatus. Musculoskeletal: No worrisome lytic or sclerotic osseous abnormality. Multiple nonacute right rib fractures are evident with possible acute nondisplaced fracture of the lateral right fifth rib (91/2). Mild superior endplate compression deformity noted at T5. Patient is status post cervicothoracic fusion. IMPRESSION: 1. Bilateral lower lobe collapse/consolidation with small to moderate bilateral pleural effusions. Interstitial and patchy airspace disease noted in both upper lobes. Imaging features compatible with multifocal pneumonia. 2. Mild right paratracheal lymphadenopathy is likely reactive. 3. Ascending thoracic aorta measures 5.5 cm diameter. Descending aorta measures 5.2 cm diameter at the level of the diaphragmatic hiatus. Recommend semi-annual imaging followup by CTA or MRA and referral to cardiothoracic surgery if not already obtained. This recommendation follows 2010 ACCF/AHA/AATS/ACR/ASA/SCA/SCAI/SIR/STS/SVM Guidelines for the Diagnosis and Management of Patients With Thoracic Aortic Disease. Circulation. 2010; 121: E266-e369TAA. Aortic aneurysm NOS (ICD10-I71.9) Electronically Signed   By: Kennith Center M.D.   On: 03/20/2019 16:10       Indwelling Urinary Catheter continued, requirement due to   Reason to continue Indwelling Urinary Catheter strict Intake/Output monitoring for hemodynamic instability   Central Line/ continued, requirement due to  Reason to continue [employer]  Monitoring of central venous pressure or other hemodynamic parameters and poor IV access   Ventilator continued, requirement due to severe respiratory failure   Ventilator Sedation RASS 0 to -2      ASSESSMENT AND PLAN SYNOPSIS  63 yo WM with acute metabolic encephalopathy from severe metabolic acidosis and severe sepsis with RT Lung pneumonia with RT  elbow effusion and multiple skin lesions complicated by acute renal failure associated with malnourishment and FTT.Now complicated by acute resp failure requiring intubation with DT's and aspiration pneumonia   Severe ACUTE Hypoxic and  Hypercapnic Respiratory Failure -continue Full MV support -continue Bronchodilator Therapy -Wean Fio2 and PEEP as tolerated -failed SAT/SBT willtry again when deemed appropriate   ACUTE KIDNEY INJURY/Renal Failure -follow chem 7 -follow UO -continue Foley Catheter-assess need -Avoid nephrotoxic agents -Recheck creatinine    NEUROLOGY - intubated and sedated - minimal sedation to achieve a RASS goal: -1 Severe DT's   SHOCK-SEPSIS/HYPOVOLUMIC -use vasopressors to keep MAP>65 -follow ABG and LA -stress dose steroids -aggressive IV fluid resuscitation  CARDIAC ICU monitoring  ID- MRSA BACTEREMIA,  RT ELBOW SEPTIC ARTHRITIS/OSTEO B/L EAR INFECTIONS C DIFF COLITIS  5/17 BCx: 4/4 bottles MRSA  5/17 UCx: >100K MRSA  5/17 MRSA PCR: positive  5/17 COVID: negative  5/18 Clostridium Difficile: positive 5/19 BCx: sent  -continue IV abx as prescibed -follow up cultures  GI GI PROPHYLAXIS as indicated  NUTRITIONAL STATUS DIET-->TF's as tolerated Constipation protocol as indicated  ENDO - will use ICU hypoglycemic\Hyperglycemia protocol if indicated   ELECTROLYTES -follow labs as needed -replace as needed -pharmacy consultation and following   DVT/GI PRX ordered TRANSFUSIONS AS NEEDED MONITOR FSBS ASSESS the need for LABS as needed   Critical Care Time devoted to patient  care services described in this note is 45 minutes.   Overall, patient is critically ill, prognosis is guarded.  Patient with Multiorgan failure and at high risk for cardiac arrest and death.    Palliative care team following  Lucie LeatherKurian David Arville Postlewaite, M.D.  [facility]  Medical Director [facility] Medical Director [facility] Cardio-Pulmonary Department

## 2019-03-21 NOTE — Progress Notes (Signed)
Met with patient's neighbor Ysidro Evert- I gave him the patient's cell phone only.  No book with numbers was found in patients bag.  Hopefully he will be able to get in contact with any family for the patient.

## 2019-03-21 NOTE — Progress Notes (Signed)
Pharmacy Antibiotic Note  Leonard Peters is a 63 y.o. male admitted on 2019-03-19 with multiple infections. Patient with recent history of drug abuse and presents with clostridium difficile, MRSA bacteremia, cellulitis, UTI, and possible pneumonia. Patient with left heart catheterization and clostridium difficile infection in March 2020. Pharmacy has been consulted for vancomycin dosing.  Tee negative for endocarditis.  Plan:  Continue vancomycin to 1500 mg IV q48hr. If creatinine clearance/serum creatinine continues to improve, anticipate adjusting schedule. Hesitant to dose more frequently than q48h as very little urine output charted. Will reassess SCr with morning labs. Will plan to get random vancomycin level on 5/24.    Vancomycin 500mg  PR Q6hr s/t ileus/SBO.   Height: 6\' 3"  (190.5 cm) Weight: 172 lb 6.4 oz (78.2 kg) IBW/kg (Calculated) : 84.5  Temp (24hrs), Avg:98.5 F (36.9 C), Min:97.5 F (36.4 C), Max:99.7 F (37.6 C)  Recent Labs  Lab 03-19-2019 1216 Mar 19, 2019 1502 Mar 19, 2019 1938  03/17/19 0538 03/17/19 0636 03/18/19 0422 03/19/19 0243 03/20/19 0425 03/21/19 0530  WBC 25.6*  --   --    < >  --  21.0* 21.6* 19.6* 19.2* 11.3*  CREATININE 4.60*  --  4.28*  --  3.56*  --  2.77* 2.32* 2.36* 2.15*  LATICACIDVEN >11.0* 5.7* 1.9  --   --   --   --   --   --   --   VANCORANDOM  --   --   --   --   --   --   --  16  --   --    < > = values in this interval not displayed.    Estimated Creatinine Clearance: 38.9 mL/min (A) (by C-G formula based on SCr of 2.15 mg/dL (H)).    Allergies  Allergen Reactions  . Lipitor [Atorvastatin Calcium]     Elevated LFTs    Antimicrobials this admission: Cefepime 5/17 x 1 Metronidazole 5/17, 5/18 >> 5/20, 5/22 x 1  Vancomycin IV 5/17 >>  Vancomycin PO 5/18 >> 5/18, PR 5/18 >> 5/19, PO 5/19 >> Ceftriaxone 5/18 x 1  Doxycycline 5/18 x 1   Dose adjustments this admission: 5/18 Vancomycin PO adjusted to 500mg  PO Q6hr.  5/19 Vancomycin PO  adjusted to PR  5/20 Vanc IV 1000 mg q48h >> IV 1500 mg q48h  Microbiology results: 5/17 BCx: 4/4 bottles MRSA  5/17 UCx: >100K MRSA  5/17 MRSA PCR: positive  5/17 COVID: negative  5/18 Clostridium Difficile: positive 5/19 BCx: no growth x 3 days  5/21 Left Toe: pending   Thank you for allowing pharmacy to be a part of this patient's care.  Simpson,Michael L, PharmD 03/21/2019 3:42 PM

## 2019-03-21 NOTE — Progress Notes (Signed)
Spoke to patient's neighbor Riki Rusk and gave him an update.  He stated that he would be coming by the hospital to pick up patient's phone and book to see if he can contact patient's family.  Per Riki Rusk the patient has a family member out of state and believes this family member's contact information is in patient's phone and book.

## 2019-03-21 NOTE — Progress Notes (Signed)
[facility]   PATIENT NAME: Leonard Peters    MR#:  [id number]  DATE OF BIRTH:  1955/12/18  SUBJECTIVE:  CHIEF COMPLAINT:   Chief Complaint  Patient presents with  . Altered Mental Status  Patient seen and evaluated today Intubated and on ventilator TEE negative for endocarditis Still has loose stools  REVIEW OF SYSTEMS:    ROS Unable to obtain as patient is critically ill DRUG ALLERGIES:   Allergies  Allergen Reactions  . Lipitor [Atorvastatin Calcium]     Elevated LFTs    VITALS:  Blood pressure (!) 98/55, pulse (!) 110, temperature 99.7 F (37.6 C), temperature source Axillary, resp. rate 19, height 6\' 3"  (1.905 m), weight 78.2 kg, SpO2 99 %.  PHYSICAL EXAMINATION:   Physical Exam  GENERAL:  63 y.o.-year-old patient lying in the bed on ventilator EYES: Pupils equal, round, reactive to light and accommodation. No scleral icterus. Extraocular muscles intact.  HEENT: Head atraumatic, normocephalic. Oropharynx and nasopharynx clear.  NECK:  Supple, no jugular venous distention. No thyroid enlargement, no tenderness.  LUNGS: Decreased breath sounds bilaterally, rales heard in both lungs Tidal volume 450 Rate 15 PEEP 5 FiO2 35 % CARDIOVASCULAR: S1, S2 normal. No murmurs, rubs, or gallops.  ABDOMEN: Soft, nontender, nondistended. Bowel sounds present. No organomegaly or mass.  EXTREMITIES: No cyanosis, clubbing or edema b/l.    NEUROLOGIC: On ventilator could not be assessed PSYCHIATRIC: The patient is alert and oriented none SKIN: Has multiple red lesions over extremities  LABORATORY PANEL:   CBC Recent Labs  Lab 03/21/19 0530  WBC 11.3*  HGB 8.2*  HCT 25.6*  PLT 74*   ------------------------------------------------------------------------------------------------------------------ Chemistries  Recent Labs  Lab 29-Mar-2019 1216  03/21/19 0530  NA 131*   < > 135  K 2.1*   < > 4.1  CL 92*   < > 103  CO2 11*   < >  24  GLUCOSE 137*   < > 140*  BUN 58*   < > 56*  CREATININE 4.60*   < > 2.15*  CALCIUM 7.6*   < > 7.6*  MG  --    < > 1.9  AST 57*  --   --   ALT 18  --   --   ALKPHOS 123  --   --   BILITOT 0.7  --   --    < > = values in this interval not displayed.   ------------------------------------------------------------------------------------------------------------------  Cardiac Enzymes No results for input(s): TROPONINI in the last 168 hours. ------------------------------------------------------------------------------------------------------------------  RADIOLOGY:  Ct Head Wo Contrast  Result Date: 03/20/2019 CLINICAL DATA:  Lung cancer staging EXAM: CT HEAD WITHOUT CONTRAST TECHNIQUE: Contiguous axial images were obtained from the base of the skull through the vertex without intravenous contrast. COMPARISON:  None. FINDINGS: Brain: There is no mass, hemorrhage or extra-axial collection. The size and configuration of the ventricles and extra-axial CSF spaces are normal. There is hypoattenuation of the white matter, most commonly indicating chronic small vessel disease. Vascular: No abnormal hyperdensity of the major intracranial arteries or dural venous sinuses. No intracranial atherosclerosis. Skull: The visualized skull base, calvarium and extracranial soft tissues are normal. Sinuses/Orbits: No fluid levels or advanced mucosal thickening of the visualized paranasal sinuses. No mastoid or middle ear effusion. The orbits are normal. IMPRESSION: Mild chronic white matter small vessel ischemic changes without acute abnormality. No evidence for intracranial metastatic disease on this noncontrast examination. Electronically Signed   By: Deatra Robinson  M.D.   On: 03/20/2019 15:49   Ct Chest Wo Contrast  Result Date: 03/20/2019 CLINICAL DATA:  Ventilator dependence.  Decreased breath sounds. EXAM: CT CHEST WITHOUT CONTRAST TECHNIQUE: Multidetector CT imaging of the chest was performed following the  standard protocol without IV contrast. COMPARISON:  03/19/2019 chest x-ray FINDINGS: Cardiovascular: The heart size is normal. No substantial pericardial effusion. Coronary artery calcification is evident. Ascending thoracic aorta measures up to 5.5 cm diameter. Atherosclerotic calcification is noted in the wall of the thoracic aorta. Right central line tip is positioned in the mid SVC. Mediastinum/Nodes: Endotracheal tube tip visualized in the upper trachea. 11 mm short axis right paratracheal lymph node is mildly enlarged. Hilar regions not well evaluated for lymphadenopathy given collapse/consolidative changes in the parahilar lungs. The esophagus has normal imaging features. There is no axillary lymphadenopathy. Lungs/Pleura: Interstitial and patchy airspace disease is identified in the posterior right upper lobe and right middle lobe with collapse/consolidation of the right lower lobe. Similar mild interstitial and patchy airspace opacity noted posterior left upper lobe with collapse/consolidative change in the left lower lobe. Small to moderate pleural effusions are noted bilaterally. Upper Abdomen: Perihepatic ascites noted in the upper abdomen. Stomach is distended with fluid. Aorta measures 5.2 cm in diameter at the level of the diaphragmatic hiatus. Musculoskeletal: No worrisome lytic or sclerotic osseous abnormality. Multiple nonacute right rib fractures are evident with possible acute nondisplaced fracture of the lateral right fifth rib (91/2). Mild superior endplate compression deformity noted at T5. Patient is status post cervicothoracic fusion. IMPRESSION: 1. Bilateral lower lobe collapse/consolidation with small to moderate bilateral pleural effusions. Interstitial and patchy airspace disease noted in both upper lobes. Imaging features compatible with multifocal pneumonia. 2. Mild right paratracheal lymphadenopathy is likely reactive. 3. Ascending thoracic aorta measures 5.5 cm diameter. Descending  aorta measures 5.2 cm diameter at the level of the diaphragmatic hiatus. Recommend semi-annual imaging followup by CTA or MRA and referral to cardiothoracic surgery if not already obtained. This recommendation follows 2010 ACCF/AHA/AATS/ACR/ASA/SCA/SCAI/SIR/STS/SVM Guidelines for the Diagnosis and Management of Patients With Thoracic Aortic Disease. Circulation. 2010; 121: E266-e369TAA. Aortic aneurysm NOS (ICD10-I71.9) Electronically Signed   By: Kennith CenterEric  Mansell M.D.   On: 03/20/2019 16:10   Dg Chest Port 1 View  Result Date: 03/20/2019 CLINICAL DATA:  63 year old male central line placement. EXAM: PORTABLE CHEST 1 VIEW COMPARISON:  0302 hours the same day and earlier. FINDINGS: Portable AP supine view at 2342 hours. Endotracheal tube tip at the level the clavicles. Enteric tube courses to the abdomen, tip not included. New right IJ central line in place, tip at or just below the level of the carina. No pneumothorax. Stable lung volumes. Stable mediastinal contours allowing for differences in rotation. Veiling opacity at the right lung base has increased since 02/23/19. Pulmonary vascularity has increased. Extensive previous posterior cervical and upper thoracic spine fusion. IMPRESSION: 1. Right IJ central line placed, tip at the SVC level. No pneumothorax. 2. ETT tip at the level the clavicles. Enteric tube courses to the abdomen. 3. Suspect increasing right pleural effusion since 02/23/19, and borderline to mild interstitial edema. Electronically Signed   By: Odessa FlemingH  Hall M.D.   On: 03/20/2019 00:08   Dg Abd Portable 1v  Result Date: 03/19/2019 CLINICAL DATA:  63 year old male tube placement. EXAM: PORTABLE ABDOMEN - 1 VIEW COMPARISON:  1535 hours today. FINDINGS: Portable AP semi upright view at 1749 hours. The NG tube has been removed and enteric feeding tube has been placed. The tip projects  in the medial lower abdomen, likely within the distal aspect of a ptotic stomach. There probably is adequate slack  to allow for post pyloric transit. Otherwise stable abdomen and visible lower chest. IMPRESSION: Feeding tube placed, tip likely in the distal aspect of a ptotic stomach. Electronically Signed   By: Odessa FlemingH  Hall M.D.   On: 03/19/2019 19:59   Dg Abd Portable 1v  Result Date: 03/19/2019 CLINICAL DATA:  OG tube placement EXAM: PORTABLE ABDOMEN - 1 VIEW COMPARISON:  Earlier same day; 03/18/2019 FINDINGS: Interval advancement of enteric tube with side port now projected the expected location of the GE junction, similar to the 02/2018 examination. Gaseous distention of the colon and cecum. No pneumoperitoneum, pneumatosis or portal venous gas. Limited visualization of the lower thorax demonstrates suspected trace bilateral effusions with associated bibasilar opacities. Mild scoliotic curvature of the thoracolumbar spine, potentially positional. IMPRESSION: Interval advancement of enteric tube with side port now projected over the expected location of the GE junction. Advancement an additional 8 cm could be performed as indicated. Electronically Signed   By: Simonne ComeJohn  Watts M.D.   On: 03/19/2019 16:11   Dg Abd Portable 1v  Result Date: 03/19/2019 CLINICAL DATA:  Ileus. EXAM: PORTABLE ABDOMEN - 1 VIEW COMPARISON:  03/18/2019 FINDINGS: There is gaseous distention of multiple loops of small bowel and colon. There is a relative paucity of bowel gas at the level of the rectum. The nasogastric tube is suboptimally positioned in the esophagus. Bibasilar airspace opacities are noted IMPRESSION: 1. Nasogastric tube in the esophagus.  Repositioning is recommended. 2. Persistent mildly dilated loops of small bowel. 3. Bibasilar airspace opacities. Consider dedicated chest radiograph for further evaluation. Electronically Signed   By: Katherine Mantlehristopher  Green M.D.   On: 03/19/2019 13:41     ASSESSMENT AND PLAN:  63 year old male patient currently in the ICU for sepsis  -Acute hypoxic respiratory failure Continue mechanical  ventilation Continue vent bundle PEEP as tolerated Intensivist follow-up Weaning trials when appropriate  -Sepsis severe with hypovolemia and shock IV fluids Broad-spectrum antibiotics Follow-up cultures IV pressors : vasopressin, levophed and neosynephrine Stress dose steroid  -Acute Metabolic Encephalopathy Supprotive care  -MRSA bacteremia Continue broad-spectrum antibiotics TEE negative for endocarditis COVID test negative Infectious disease follow-up appreciated  -Type B aortic dissection S/p vascular surgery evaluation  -Right lung pneumonia with opacity Continue IV antibiotics CT chest down the line  -Acute kidney injury Avoid nephrotoxic drugs IV fluids and monitor renal function  -Right elbow cellulitis Follow-up MRI to check for any osteomyelitis  -Adult failure to thrive Nutritional supplements  -C. difficile colitis Contact and enteric precautions Continue Flagyl and vancomycin Switch to oral meds once tolerated  -Hypotension secondary to sepsis IV fluids and IV pressors as needed  -Acute systolic heart failure Lasix as needed Cardiology recommendations  -Right elbow osteomyelitis Status post orthopedic evaluation Wound VAC recommended  -Palliative care on board  -Guarded prognosis   All the records are reviewed and case discussed with Care Management/Social Worker. Management plans discussed with the patient, family and they are in agreement.  CODE STATUS: Full code  DVT Prophylaxis: SCDs  TOTAL TIME TAKING CARE OF THIS PATIENT: 37 minutes.   POSSIBLE D/C IN 2 to 3 DAYS, DEPENDING ON CLINICAL CONDITION.  Ihor AustinPavan Jonella Redditt M.D on 03/21/2019 at 11:46 AM  Between 7am to 6pm - Pager - [phone]  After 6pm go to [email or web address] - password [email or web address] [facility]  [facility]  Office  [phone]  CC: Primary care physician; System, Pcp Not  In  Note: This dictation was prepared with Dragon dictation along with smaller phrase  technology. Any transcriptional errors that result from this process are unintentional.

## 2019-03-21 NOTE — Progress Notes (Signed)
Patient failed sedation vacation- patient had labored breathing- tolerated pressure support mode for approximately 30 min.  Patient placed back on a rate per Dr. Belia Heman.

## 2019-03-21 NOTE — Progress Notes (Signed)
Date of Admission:  03/15/2019         Remains intubated Needing more pressure support- on 3 different pressors Urine output down to 155m in 24 hrs  Medications:  . chlorhexidine gluconate (MEDLINE KIT)  15 mL Mouth Rinse BID  . feeding supplement (PRO-STAT SUGAR FREE 64)  30 mL Per Tube BID  . FLUoxetine  40 mg Per Tube Daily  . folic acid  1 mg Per Tube Daily  . heparin injection (subcutaneous)  5,000 Units Subcutaneous Q8H  . hydrocortisone sod succinate (SOLU-CORTEF) inj  50 mg Intravenous Q6H  . insulin aspart  0-15 Units Subcutaneous Q4H  . mouth rinse  15 mL Mouth Rinse 10 times per day  . multivitamin  15 mL Per Tube Daily  . mupirocin ointment  1 application Nasal BID  . nutrition supplement (JUVEN)  1 packet Per Tube BID BM  . pantoprazole (PROTONIX) IV  40 mg Intravenous Q24H  . thiamine  100 mg Per Tube Daily  . vancomycin  500 mg Oral Q6H    Objective: Vital signs in last 24 hours: Temp:  [96.1 F (35.6 C)-99.7 F (37.6 C)] 99 F (37.2 C) (05/22 1200) Pulse Rate:  [66-138] 102 (05/22 1200) Resp:  [11-32] 17 (05/22 1200) BP: (75-120)/(42-88) 94/58 (05/22 1200) SpO2:  [88 %-100 %] 91 % (05/22 1200) FiO2 (%):  [35 %-40 %] 35 % (05/22 1100) Weight:  [78.2 kg] 78.2 kg (05/22 0500)  PHYSICAL EXAM:  General: intubated, RT neck triple lumen- on 3 pressors B.l air entry s1s2 Abdomen: Soft,  Extremities: extensive hemorrhagic, necrotic, purpuric lesions feet, fingers, nose          Skin: as above Lymph: Cervical, supraclavicular normal. Neurologic: Grossly non-focal  Lab Results Recent Labs    03/20/19 0425 03/21/19 0530  WBC 19.2* 11.3*  HGB 7.6* 8.2*  HCT 23.2* 25.6*  NA 138 135  K 4.0 4.1  CL 105 103  CO2 26 24  BUN 50* 56*  CREATININE 2.36* 2.15*   Liver Panel No results for input(s): PROT, ALBUMIN, AST, ALT, ALKPHOS, BILITOT, BILIDIR, IBILI in the last 72 hours. Sedimentation Rate No results for input(s): ESRSEDRATE in the last  72 hours. C-Reactive Protein No results for input(s): CRP in the last 72 hours.  Microbiology:  Studies/Results: Ct Head Wo Contrast  Result Date: 03/20/2019 CLINICAL DATA:  Lung cancer staging EXAM: CT HEAD WITHOUT CONTRAST TECHNIQUE: Contiguous axial images were obtained from the base of the skull through the vertex without intravenous contrast. COMPARISON:  None. FINDINGS: Brain: There is no mass, hemorrhage or extra-axial collection. The size and configuration of the ventricles and extra-axial CSF spaces are normal. There is hypoattenuation of the white matter, most commonly indicating chronic small vessel disease. Vascular: No abnormal hyperdensity of the major intracranial arteries or dural venous sinuses. No intracranial atherosclerosis. Skull: The visualized skull base, calvarium and extracranial soft tissues are normal. Sinuses/Orbits: No fluid levels or advanced mucosal thickening of the visualized paranasal sinuses. No mastoid or middle ear effusion. The orbits are normal. IMPRESSION: Mild chronic white matter small vessel ischemic changes without acute abnormality. No evidence for intracranial metastatic disease on this noncontrast examination. Electronically Signed   By: KUlyses JarredM.D.   On: 03/20/2019 15:49   Ct Chest Wo Contrast  Result Date: 03/20/2019 CLINICAL DATA:  Ventilator dependence.  Decreased breath sounds. EXAM: CT CHEST WITHOUT CONTRAST TECHNIQUE: Multidetector CT imaging of the chest was performed following the standard protocol without IV contrast.  COMPARISON:  03/19/2019 chest x-ray FINDINGS: Cardiovascular: The heart size is normal. No substantial pericardial effusion. Coronary artery calcification is evident. Ascending thoracic aorta measures up to 5.5 cm diameter. Atherosclerotic calcification is noted in the wall of the thoracic aorta. Right central line tip is positioned in the mid SVC. Mediastinum/Nodes: Endotracheal tube tip visualized in the upper trachea. 11 mm  short axis right paratracheal lymph node is mildly enlarged. Hilar regions not well evaluated for lymphadenopathy given collapse/consolidative changes in the parahilar lungs. The esophagus has normal imaging features. There is no axillary lymphadenopathy. Lungs/Pleura: Interstitial and patchy airspace disease is identified in the posterior right upper lobe and right middle lobe with collapse/consolidation of the right lower lobe. Similar mild interstitial and patchy airspace opacity noted posterior left upper lobe with collapse/consolidative change in the left lower lobe. Small to moderate pleural effusions are noted bilaterally. Upper Abdomen: Perihepatic ascites noted in the upper abdomen. Stomach is distended with fluid. Aorta measures 5.2 cm in diameter at the level of the diaphragmatic hiatus. Musculoskeletal: No worrisome lytic or sclerotic osseous abnormality. Multiple nonacute right rib fractures are evident with possible acute nondisplaced fracture of the lateral right fifth rib (91/2). Mild superior endplate compression deformity noted at T5. Patient is status post cervicothoracic fusion. IMPRESSION: 1. Bilateral lower lobe collapse/consolidation with small to moderate bilateral pleural effusions. Interstitial and patchy airspace disease noted in both upper lobes. Imaging features compatible with multifocal pneumonia. 2. Mild right paratracheal lymphadenopathy is likely reactive. 3. Ascending thoracic aorta measures 5.5 cm diameter. Descending aorta measures 5.2 cm diameter at the level of the diaphragmatic hiatus. Recommend semi-annual imaging followup by CTA or MRA and referral to cardiothoracic surgery if not already obtained. This recommendation follows 2010 ACCF/AHA/AATS/ACR/ASA/SCA/SCAI/SIR/STS/SVM Guidelines for the Diagnosis and Management of Patients With Thoracic Aortic Disease. Circulation. 2010; 121: [id number]. Aortic aneurysm NOS (ICD10-I71.9) Electronically Signed   By: Misty Stanley M.D.    On: 03/20/2019 16:10   Dg Chest Port 1 View  Result Date: 03/20/2019 CLINICAL DATA:  63 year old male central line placement. EXAM: PORTABLE CHEST 1 VIEW COMPARISON:  0302 hours the same day and earlier. FINDINGS: Portable AP supine view at 2342 hours. Endotracheal tube tip at the level the clavicles. Enteric tube courses to the abdomen, tip not included. New right IJ central line in place, tip at or just below the level of the carina. No pneumothorax. Stable lung volumes. Stable mediastinal contours allowing for differences in rotation. Veiling opacity at the right lung base has increased since 03/15/2019. Pulmonary vascularity has increased. Extensive previous posterior cervical and upper thoracic spine fusion. IMPRESSION: 1. Right IJ central line placed, tip at the SVC level. No pneumothorax. 2. ETT tip at the level the clavicles. Enteric tube courses to the abdomen. 3. Suspect increasing right pleural effusion since 03/11/2019, and borderline to mild interstitial edema. Electronically Signed   By: Genevie Ann M.D.   On: 03/20/2019 00:08   Dg Abd Portable 1v  Result Date: 03/19/2019 CLINICAL DATA:  63 year old male tube placement. EXAM: PORTABLE ABDOMEN - 1 VIEW COMPARISON:  1535 hours today. FINDINGS: Portable AP semi upright view at 1749 hours. The NG tube has been removed and enteric feeding tube has been placed. The tip projects in the medial lower abdomen, likely within the distal aspect of a ptotic stomach. There probably is adequate slack to allow for post pyloric transit. Otherwise stable abdomen and visible lower chest. IMPRESSION: Feeding tube placed, tip likely in the distal aspect of a ptotic stomach.  Electronically Signed   By: Genevie Ann M.D.   On: 03/19/2019 19:59   Dg Abd Portable 1v  Result Date: 03/19/2019 CLINICAL DATA:  OG tube placement EXAM: PORTABLE ABDOMEN - 1 VIEW COMPARISON:  Earlier same day; 03/18/2019 FINDINGS: Interval advancement of enteric tube with side port now projected the  expected location of the GE junction, similar to the 02/2018 examination. Gaseous distention of the colon and cecum. No pneumoperitoneum, pneumatosis or portal venous gas. Limited visualization of the lower thorax demonstrates suspected trace bilateral effusions with associated bibasilar opacities. Mild scoliotic curvature of the thoracolumbar spine, potentially positional. IMPRESSION: Interval advancement of enteric tube with side port now projected over the expected location of the GE junction. Advancement an additional 8 cm could be performed as indicated. Electronically Signed   By: Sandi Mariscal M.D.   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 During that hospitalization he also was positive for C. difficile. He was evaluated by CT surgery and was determined to be elective aortic surgery and sent home to come back and have his ascending aortic replacement. He presents to our ED brought in by the EMS as he was confused, incoherent and was covered from head to toe with wounds.  ? 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 Plan was to do surgery at [facility] but he did not follow up Concern for infected thrombus   Marfans syndrome ( mentioned in [facility] record  Cdiff diarrhea- on Po vanco  B/l purulent ear discharge- CT head did not reveal any acute abnormality-may need mastoid/inner ear focused films   Discussed the management wit the care team- ID will follow him peripherally this weekend. Call if needed

## 2019-03-22 ENCOUNTER — Inpatient Hospital Stay: Payer: Medicaid Other

## 2019-03-22 DIAGNOSIS — J9602 Acute respiratory failure with hypercapnia: Secondary | ICD-10-CM

## 2019-03-22 LAB — CBC WITH DIFFERENTIAL/PLATELET
Abs Immature Granulocytes: 0.07 10*3/uL (ref 0.00–0.07)
Basophils Absolute: 0 10*3/uL (ref 0.0–0.1)
Basophils Relative: 0 %
Eosinophils Absolute: 0.3 10*3/uL (ref 0.0–0.5)
Eosinophils Relative: 4 %
HCT: 26.3 % — ABNORMAL LOW (ref 39.0–52.0)
Hemoglobin: 7.9 g/dL — ABNORMAL LOW (ref 13.0–17.0)
Immature Granulocytes: 1 %
Lymphocytes Relative: 9 %
Lymphs Abs: 0.7 10*3/uL (ref 0.7–4.0)
MCH: 30.4 pg (ref 26.0–34.0)
MCHC: 30 g/dL (ref 30.0–36.0)
MCV: 101.2 fL — ABNORMAL HIGH (ref 80.0–100.0)
Monocytes Absolute: 0.1 10*3/uL (ref 0.1–1.0)
Monocytes Relative: 2 %
Neutro Abs: 6.3 10*3/uL (ref 1.7–7.7)
Neutrophils Relative %: 84 %
Platelets: 55 10*3/uL — ABNORMAL LOW (ref 150–400)
RBC: 2.6 MIL/uL — ABNORMAL LOW (ref 4.22–5.81)
RDW: 16.2 % — ABNORMAL HIGH (ref 11.5–15.5)
Smear Review: NORMAL
WBC: 7.5 10*3/uL (ref 4.0–10.5)
nRBC: 1.7 % — ABNORMAL HIGH (ref 0.0–0.2)

## 2019-03-22 LAB — BLOOD GAS, ARTERIAL
Acid-base deficit: 5.5 mmol/L — ABNORMAL HIGH (ref 0.0–2.0)
Bicarbonate: 22.6 mmol/L (ref 20.0–28.0)
FIO2: 65
MECHVT: 450 mL
Mechanical Rate: 15
O2 Saturation: 87.9 %
PEEP: 5 cmH2O
Patient temperature: 37
pCO2 arterial: 54 mmHg — ABNORMAL HIGH (ref 32.0–48.0)
pH, Arterial: 7.23 — ABNORMAL LOW (ref 7.350–7.450)
pO2, Arterial: 65 mmHg — ABNORMAL LOW (ref 83.0–108.0)

## 2019-03-22 LAB — GLUCOSE, CAPILLARY
Glucose-Capillary: 100 mg/dL — ABNORMAL HIGH (ref 70–99)
Glucose-Capillary: 114 mg/dL — ABNORMAL HIGH (ref 70–99)
Glucose-Capillary: 118 mg/dL — ABNORMAL HIGH (ref 70–99)
Glucose-Capillary: 127 mg/dL — ABNORMAL HIGH (ref 70–99)
Glucose-Capillary: 133 mg/dL — ABNORMAL HIGH (ref 70–99)
Glucose-Capillary: 46 mg/dL — ABNORMAL LOW (ref 70–99)
Glucose-Capillary: 60 mg/dL — ABNORMAL LOW (ref 70–99)
Glucose-Capillary: 68 mg/dL — ABNORMAL LOW (ref 70–99)
Glucose-Capillary: 88 mg/dL (ref 70–99)

## 2019-03-22 LAB — COMPREHENSIVE METABOLIC PANEL
ALT: 14 U/L (ref 0–44)
AST: 18 U/L (ref 15–41)
Albumin: 1.1 g/dL — ABNORMAL LOW (ref 3.5–5.0)
Alkaline Phosphatase: 134 U/L — ABNORMAL HIGH (ref 38–126)
Anion gap: 8 (ref 5–15)
BUN: 65 mg/dL — ABNORMAL HIGH (ref 8–23)
CO2: 24 mmol/L (ref 22–32)
Calcium: 7.7 mg/dL — ABNORMAL LOW (ref 8.9–10.3)
Chloride: 106 mmol/L (ref 98–111)
Creatinine, Ser: 2 mg/dL — ABNORMAL HIGH (ref 0.61–1.24)
GFR calc Af Amer: 40 mL/min — ABNORMAL LOW (ref >60)
GFR calc non Af Amer: 34 mL/min — ABNORMAL LOW (ref >60)
Glucose, Bld: 90 mg/dL (ref 70–99)
Potassium: 4.1 mmol/L (ref 3.5–5.1)
Sodium: 138 mmol/L (ref 135–145)
Total Bilirubin: 0.4 mg/dL (ref 0.3–1.2)
Total Protein: 4 g/dL — ABNORMAL LOW (ref 6.5–8.1)

## 2019-03-22 LAB — MAGNESIUM: Magnesium: 1.6 mg/dL — ABNORMAL LOW (ref 1.7–2.4)

## 2019-03-22 LAB — PHOSPHORUS: Phosphorus: 3.4 mg/dL (ref 2.5–4.6)

## 2019-03-22 LAB — LACTIC ACID, PLASMA: Lactic Acid, Venous: 2.6 mmol/L (ref 0.5–1.9)

## 2019-03-22 MED ORDER — DEXTROSE 50 % IV SOLN
1.0000 | Freq: Once | INTRAVENOUS | Status: AC
  Administered 2019-03-22: 50 mL via INTRAVENOUS
  Filled 2019-03-22: qty 50

## 2019-03-22 MED ORDER — IPRATROPIUM-ALBUTEROL 0.5-2.5 (3) MG/3ML IN SOLN
3.0000 mL | RESPIRATORY_TRACT | Status: DC
  Administered 2019-03-22 – 2019-03-23 (×6): 3 mL via RESPIRATORY_TRACT
  Filled 2019-03-22 (×6): qty 3

## 2019-03-22 MED ORDER — MAGNESIUM SULFATE 4 GM/100ML IV SOLN
4.0000 g | Freq: Once | INTRAVENOUS | Status: AC
  Administered 2019-03-22: 4 g via INTRAVENOUS
  Filled 2019-03-22: qty 100

## 2019-03-22 MED ORDER — DEXTROSE 50 % IV SOLN
25.0000 mL | Freq: Once | INTRAVENOUS | Status: AC
  Administered 2019-03-22: 25 mL via INTRAVENOUS
  Filled 2019-03-22: qty 50

## 2019-03-22 NOTE — Progress Notes (Signed)
Blood sugar 46 and 60 on re-check. NP notified orders received for 1 am of dextrose. CHL for further update.

## 2019-03-22 NOTE — Progress Notes (Signed)
Follow up - Critical Care Medicine Note  Patient Details:    Leonard BitterRichard Peters is an 63 y.o. male. Follow up respiratory failure Severe septic shock Lines, Airways, Drains: Airway 7.5 mm (Active)  Secured at (cm) 25 cm 03/22/2019  8:00 PM  Measured From Lips 03/22/2019  8:00 PM  Secured Location Center 03/22/2019  8:00 PM  Secured By [employer] Tube Holder 03/22/2019  8:00 PM  Tube Holder Repositioned Yes 03/22/2019  8:00 PM  Cuff Pressure (cm H2O) 26 cm H2O 03/22/2019  8:00 PM  Site Condition Dry 03/22/2019  8:00 PM     CVC Triple Lumen 03/20/19 Right Internal jugular (Active)  Indication for Insertion or Continuance of Line Vasoactive infusions 03/22/2019  8:00 PM  Site Assessment Clean;Dry;Intact 03/22/2019  8:00 AM  Proximal Lumen Status Infusing 03/22/2019  8:00 AM  Medial Lumen Status Infusing 03/22/2019  8:00 AM  Distal Lumen Status Infusing 03/22/2019  8:00 AM  Dressing Type Transparent;Gauze 03/22/2019  8:00 AM  Dressing Status Antimicrobial disc in place;Intact;Dry;Clean 03/22/2019  8:00 AM  Dressing Change Due 03/27/19 03/22/2019  8:00 AM     Negative Pressure Wound Therapy Elbow Posterior;Right (Active)  Last dressing change 03/18/19 03/22/2019  8:00 PM  Site / Wound Assessment Dressing in place / Unable to assess 03/22/2019  8:00 PM  Peri-wound Assessment Edema;Erythema (non-blanchable) 03/20/2019  3:30 PM  Cycle Continuous;On 03/22/2019  8:00 PM  Target Pressure (mmHg) 125 03/22/2019  8:00 PM  Canister Changed No 03/20/2019  4:00 AM  Dressing Status Intact 03/22/2019  8:00 PM  Drainage Amount Minimal 03/22/2019  8:00 PM  Drainage Description Serosanguineous 03/22/2019  4:30 PM  Output (mL) 0 mL 03/22/2019  5:59 PM     NG/OG Tube Nasogastric Right nare Documented cm marking at nare/ corner of mouth 70 cm (Active)  Cm Marking at Nare/Corner of Mouth (if applicable) 70 cm 03/22/2019 12:00 PM  Site Assessment Clean;Dry;Intact 03/22/2019  8:00 PM  Ongoing Placement Verification No change in cm  markings or external length of tube from initial placement;No change in respiratory status;No acute changes, not attributed to clinical condition 03/22/2019  8:00 PM  Status Infusing tube feed 03/22/2019  8:00 PM     Rectal Tube/Pouch (Active)  Output (mL) 0 mL 03/22/2019  5:59 PM     Urethral Catheter Leonard Generalaylor Foster, RN Straight-tip;Latex 16 Fr. (Active)  Indication for Insertion or Continuance of Catheter Acute urinary retention (I&O Cath for 24 hrs prior to catheter insertion- Inpatient Only) 03/22/2019  8:00 PM  Site Assessment Clean;Intact;Dry 03/22/2019  8:00 PM  Catheter Maintenance Bag below level of bladder;Catheter secured;Drainage bag/tubing not touching floor;Insertion date on drainage bag;No dependent loops;Seal intact 03/22/2019  8:00 PM  Collection Container Standard drainage bag 03/22/2019  8:00 PM  Securement Method Securing device (Describe) 03/22/2019  8:00 PM  Urinary Catheter Interventions Unclamped 03/22/2019  8:00 PM  Input (mL) 0 mL 03/19/2019  8:00 PM  Output (mL) 75 mL 03/22/2019  5:59 PM    Anti-infectives:  Anti-infectives (From admission, onward)   Start     Dose/Rate Route Frequency Ordered Stop   03/21/19 0645  metroNIDAZOLE (FLAGYL) IVPB 500 mg  Status:  Discontinued     500 mg 100 mL/hr over 60 Minutes Intravenous Every 8 hours 03/21/19 0637 03/21/19 1016   03/19/19 1800  vancomycin (VANCOCIN) 50 mg/mL oral solution 500 mg     500 mg Oral Every 6 hours 03/19/19 1544     03/19/19 1545  vancomycin (VANCOCIN) 50 mg/mL oral solution  125 mg  Status:  Discontinued     125 mg Oral 4 times daily 03/19/19 1543 03/19/19 1544   03/19/19 1000  vancomycin (VANCOCIN) IVPB 1000 mg/200 mL premix  Status:  Discontinued     1,000 mg 200 mL/hr over 60 Minutes Intravenous Every 48 hours 03/17/19 1419 03/19/19 0956   03/19/19 1000  vancomycin (VANCOCIN) 1,500 mg in sodium chloride 0.9 % 500 mL IVPB     1,500 mg 250 mL/hr over 120 Minutes Intravenous Every 48 hours 03/19/19 0956      03/18/19 1800  vancomycin (VANCOCIN) 500 mg in sodium chloride irrigation 0.9 % 100 mL ENEMA  Status:  Discontinued     500 mg Rectal Every 6 hours 03/18/19 1613 03/19/19 1544   03/18/19 1600  metroNIDAZOLE (FLAGYL) IVPB 500 mg  Status:  Discontinued     500 mg 100 mL/hr over 60 Minutes Intravenous Every 8 hours 03/18/19 1353 03/19/19 1544   03/18/19 0800  metroNIDAZOLE (FLAGYL) IVPB 500 mg  Status:  Discontinued     500 mg 100 mL/hr over 60 Minutes Intravenous Every 8 hours 03/18/19 0046 03/18/19 0957   03/17/19 1800  vancomycin (VANCOCIN) 50 mg/mL oral solution 500 mg  Status:  Discontinued     500 mg Oral Every 6 hours 03/17/19 1438 03/18/19 1613   03/17/19 1500  cefTRIAXone (ROCEPHIN) 2 g in sodium chloride 0.9 % 100 mL IVPB  Status:  Discontinued     2 g 200 mL/hr over 30 Minutes Intravenous Daily 03/17/19 1450 03/18/19 0740   03/17/19 1200  ceFEPIme (MAXIPIME) 2 g in sodium chloride 0.9 % 100 mL IVPB  Status:  Discontinued     2 g 200 mL/hr over 30 Minutes Intravenous Every 24 hours 03/11/2019 1705 03/17/19 0915   03/17/19 1030  doxycycline (VIBRAMYCIN) 100 mg in sodium chloride 0.9 % 250 mL IVPB  Status:  Discontinued     100 mg 125 mL/hr over 120 Minutes Intravenous 2 times daily 03/17/19 1016 03/17/19 1522   03/17/19 1030  cefTRIAXone (ROCEPHIN) 1 g in sodium chloride 0.9 % 100 mL IVPB  Status:  Discontinued     1 g 200 mL/hr over 30 Minutes Intravenous Daily 03/17/19 1016 03/17/19 1450   03/17/19 1000  vancomycin (VANCOCIN) IVPB 1000 mg/200 mL premix  Status:  Discontinued     1,000 mg 200 mL/hr over 60 Minutes Intravenous Every 48 hours 03/01/2019 1705 03/17/19 1420   03/17/19 1000  metroNIDAZOLE (FLAGYL) IVPB 500 mg  Status:  Discontinued     500 mg 100 mL/hr over 60 Minutes Intravenous Every 8 hours 03/17/19 0915 03/18/19 0045   03/17/19 0700  vancomycin (VANCOCIN) 50 mg/mL oral solution 125 mg  Status:  Discontinued     125 mg Oral Every 6 hours 03/17/19 0659 03/17/19 1438    03/17/2019 1300  vancomycin (VANCOCIN) IVPB 1000 mg/200 mL premix     1,000 mg 200 mL/hr over 60 Minutes Intravenous  Once 03/13/2019 1245 03/08/2019 1441   03/19/2019 1215  ceFEPIme (MAXIPIME) 2 g in sodium chloride 0.9 % 100 mL IVPB     2 g 200 mL/hr over 30 Minutes Intravenous  Once 03/15/2019 1202 03/22/2019 1331   03/20/2019 1215  metroNIDAZOLE (FLAGYL) IVPB 500 mg     500 mg 100 mL/hr over 60 Minutes Intravenous  Once 03/29/2019 1202 03/19/2019 1357   03/07/2019 1215  vancomycin (VANCOCIN) IVPB 1000 mg/200 mL premix  Status:  Discontinued     1,000 mg 200 mL/hr over 60  Minutes Intravenous  Once 03/25/2019 1202 03/15/2019 1245      Microbiology: Results for orders placed or performed during the hospital encounter of 02/28/2019  Blood Culture (routine x 2)     Status: Abnormal   Collection Time: 03/12/2019 12:16 PM  Result Value Ref Range Status   Specimen Description   Final    BLOOD BLOOD RIGHT FOREARM Performed at [facility], [address], [place], [place] [address]    Special Requests   Final    BOTTLES DRAWN AEROBIC AND ANAEROBIC Blood Culture results may not be optimal due to an excessive volume of blood received in culture bottles Performed at [facility], [address] [place], [place] [address]    Culture  Setup Time   Final    GRAM POSITIVE COCCI IN BOTH AEROBIC AND ANAEROBIC BOTTLES CRITICAL RESULT CALLED TO, READ BACK BY AND VERIFIED WITH: JASON ROBBINS ON 03/17/2019 AT 0012 QSD Performed at [facility], [address] [place], [place] [address]    Culture (A)  Final    STAPHYLOCOCCUS AUREUS SUSCEPTIBILITIES PERFORMED ON PREVIOUS CULTURE WITHIN THE LAST 5 DAYS. Performed at [facility], [address], [place], [place] [address]    Report Status 03/19/2019 FINAL  Final  Blood Culture (routine x 2)     Status: Abnormal   Collection Time: 03/06/2019 12:16 PM  Result Value Ref Range Status   Specimen Description   Final    BLOOD LAC Performed  at [facility], [address] [place], [place] [address]    Special Requests   Final    BOTTLES DRAWN AEROBIC AND ANAEROBIC Blood Culture results may not be optimal due to an excessive volume of blood received in culture bottles Performed at [facility], [address], [place], [place] [address]    Culture  Setup Time   Final    GRAM POSITIVE COCCI IN BOTH AEROBIC AND ANAEROBIC BOTTLES CRITICAL RESULT CALLED TO, READ BACK BY AND VERIFIED WITH: JASON ROBBINS ON 03/17/2019 AT 0012 QSD Performed at [facility], [address], [place], [place] [address]    Culture METHICILLIN RESISTANT STAPHYLOCOCCUS AUREUS (A)  Final   Report Status 03/19/2019 FINAL  Final   Organism ID, Bacteria METHICILLIN RESISTANT STAPHYLOCOCCUS AUREUS  Final      Susceptibility   Methicillin resistant staphylococcus aureus - MIC*    CIPROFLOXACIN <=0.5 SENSITIVE Sensitive     ERYTHROMYCIN <=0.25 SENSITIVE Sensitive     GENTAMICIN <=0.5 SENSITIVE Sensitive     OXACILLIN >=4 RESISTANT Resistant     TETRACYCLINE <=1 SENSITIVE Sensitive     VANCOMYCIN 1 SENSITIVE Sensitive     TRIMETH/SULFA <=10 SENSITIVE Sensitive     CLINDAMYCIN <=0.25 SENSITIVE Sensitive     RIFAMPIN <=0.5 SENSITIVE Sensitive     Inducible Clindamycin NEGATIVE Sensitive     * METHICILLIN RESISTANT STAPHYLOCOCCUS AUREUS  Urine culture     Status: Abnormal   Collection Time: 03/22/2019 12:16 PM  Result Value Ref Range Status   Specimen Description   Final    URINE, RANDOM Performed at [facility], [address], [place], [place] [address]    Special Requests   Final    NONE Performed at [facility], [address] [place], [place] [address]    Culture (A)  Final    >=100,000 COLONIES/mL METHICILLIN RESISTANT STAPHYLOCOCCUS AUREUS   Report Status 03/18/2019 FINAL  Final   Organism ID, Bacteria METHICILLIN RESISTANT STAPHYLOCOCCUS  AUREUS (A)  Final      Susceptibility   Methicillin  resistant staphylococcus aureus - MIC*    CIPROFLOXACIN <=0.5 SENSITIVE Sensitive     GENTAMICIN <=0.5 SENSITIVE Sensitive     NITROFURANTOIN 32 SENSITIVE Sensitive     OXACILLIN >=4 RESISTANT Resistant     TETRACYCLINE <=1 SENSITIVE Sensitive     VANCOMYCIN 1 SENSITIVE Sensitive     TRIMETH/SULFA <=10 SENSITIVE Sensitive     CLINDAMYCIN <=0.25 SENSITIVE Sensitive     RIFAMPIN <=0.5 SENSITIVE Sensitive     Inducible Clindamycin NEGATIVE Sensitive     * >=100,000 COLONIES/mL METHICILLIN RESISTANT STAPHYLOCOCCUS AUREUS  SARS Coronavirus 2 (CEPHEID- Performed in [facility] hospital lab), Hosp Order     Status: None   Collection Time: April 14, 2019 12:16 PM  Result Value Ref Range Status   SARS Coronavirus 2 NEGATIVE NEGATIVE Final    Comment: (NOTE) If result is NEGATIVE SARS-CoV-2 target nucleic acids are NOT DETECTED. The SARS-CoV-2 RNA is generally detectable in upper and lower  respiratory specimens during the acute phase of infection. The lowest  concentration of SARS-CoV-2 viral copies this assay can detect is 250  copies / mL. A negative result does not preclude SARS-CoV-2 infection  and should not be used as the sole basis for treatment or other  patient management decisions.  A negative result may occur with  improper specimen collection / handling, submission of specimen other  than nasopharyngeal swab, presence of viral mutation(s) within the  areas targeted by this assay, and inadequate number of viral copies  (<250 copies / mL). A negative result must be combined with clinical  observations, patient history, and epidemiological information. If result is POSITIVE SARS-CoV-2 target nucleic acids are DETECTED. The SARS-CoV-2 RNA is generally detectable in upper and lower  respiratory specimens dur ing the acute phase of infection.  Positive  results are indicative of active infection with SARS-CoV-2.  Clinical  correlation with patient history and other diagnostic information  is  necessary to determine patient infection status.  Positive results do  not rule out bacterial infection or co-infection with other viruses. If result is PRESUMPTIVE POSTIVE SARS-CoV-2 nucleic acids MAY BE PRESENT.   A presumptive positive result was obtained on the submitted specimen  and confirmed on repeat testing.  While 2019 novel coronavirus  (SARS-CoV-2) nucleic acids may be present in the submitted sample  additional confirmatory testing may be necessary for epidemiological  and / or clinical management purposes  to differentiate between  SARS-CoV-2 and other Sarbecovirus currently known to infect humans.  If clinically indicated additional testing with an alternate test  methodology [phone]) is advised. The SARS-CoV-2 RNA is generally  detectable in upper and lower respiratory sp ecimens during the acute  phase of infection. The expected result is Negative. Fact Sheet for Patients:  BoilerBrush.com.cy Fact Sheet for Healthcare Providers: https://pope.com/ This test is not yet approved or cleared by the [place] FDA and has been authorized for detection and/or diagnosis of SARS-CoV-2 by FDA under an Emergency Use Authorization (EUA).  This EUA will remain in effect (meaning this test can be used) for the duration of the COVID-19 declaration under Section 564(b)(1) of the Act, 21 U.S.C. section 360bbb-3(b)(1), unless the authorization is terminated or revoked sooner. Performed at [facility], [address], [place], [place] [address]   Blood Culture ID Panel (Reflexed)     Status: Abnormal   Collection Time: 04/14/2019 12:16 PM  Result Value Ref Range Status  Enterococcus species NOT DETECTED NOT DETECTED Final   Listeria monocytogenes NOT DETECTED NOT DETECTED Final   Staphylococcus species DETECTED (A) NOT DETECTED Final    Comment: CRITICAL RESULT CALLED TO, READ BACK BY AND VERIFIED WITH: JASON ROBBINS  ON 03/17/2019 AT 0012 QSD    Staphylococcus aureus (BCID) DETECTED (A) NOT DETECTED Final    Comment: Methicillin (oxacillin)-resistant Staphylococcus aureus (MRSA). MRSA is predictably resistant to beta-lactam antibiotics (except ceftaroline). Preferred therapy is vancomycin unless clinically contraindicated. Patient requires contact precautions if  hospitalized. CRITICAL RESULT CALLED TO, READ BACK BY AND VERIFIED WITH: JASON ROBBINS ON 03/17/2019 AT 0012 QSD    Methicillin resistance DETECTED (A) NOT DETECTED Final    Comment: CRITICAL RESULT CALLED TO, READ BACK BY AND VERIFIED WITH: JASON ROBBINS ON 03/17/2019 AT 0012 QSD    Streptococcus species NOT DETECTED NOT DETECTED Final   Streptococcus agalactiae NOT DETECTED NOT DETECTED Final   Streptococcus pneumoniae NOT DETECTED NOT DETECTED Final   Streptococcus pyogenes NOT DETECTED NOT DETECTED Final   Acinetobacter baumannii NOT DETECTED NOT DETECTED Final   Enterobacteriaceae species NOT DETECTED NOT DETECTED Final   Enterobacter cloacae complex NOT DETECTED NOT DETECTED Final   Escherichia coli NOT DETECTED NOT DETECTED Final   Klebsiella oxytoca NOT DETECTED NOT DETECTED Final   Klebsiella pneumoniae NOT DETECTED NOT DETECTED Final   Proteus species NOT DETECTED NOT DETECTED Final   Serratia marcescens NOT DETECTED NOT DETECTED Final   Haemophilus influenzae NOT DETECTED NOT DETECTED Final   Neisseria meningitidis NOT DETECTED NOT DETECTED Final   Pseudomonas aeruginosa NOT DETECTED NOT DETECTED Final   Candida albicans NOT DETECTED NOT DETECTED Final   Candida glabrata NOT DETECTED NOT DETECTED Final   Candida krusei NOT DETECTED NOT DETECTED Final   Candida parapsilosis NOT DETECTED NOT DETECTED Final   Candida tropicalis NOT DETECTED NOT DETECTED Final    Comment: Performed at [facility], [address] [place], [place] [address]  MRSA PCR Screening     Status: Abnormal   Collection Time: October 16, 2019  6:16 PM   Result Value Ref Range Status   MRSA by PCR POSITIVE (A) NEGATIVE Final    Comment:        The GeneXpert MRSA Assay (FDA approved for NASAL specimens only), is one component of a comprehensive MRSA colonization surveillance program. It is not intended to diagnose MRSA infection nor to guide or monitor treatment for MRSA infections. RESULT CALLED TO, READ BACK BY AND VERIFIED WITH: PREDOME,F AT 2001 ON 10-Sep-2019 BY MOSLEY,J Performed at [facility], [address] [place], [place] [address]   C difficile quick scan w PCR reflex     Status: Abnormal   Collection Time: 03/17/19  4:45 AM  Result Value Ref Range Status   C Diff antigen POSITIVE (A) NEGATIVE Final   C Diff toxin POSITIVE (A) NEGATIVE Corrected    Comment: CRITICAL RESULT CALLED TO, READ BACK BY AND VERIFIED WITH: FELICIA PREUDHOMME @0545  03/17/19 [facility] CORRECTED ON 05/19 AT 0853: PREVIOUSLY REPORTED AS POSITIVE RESULT CALLED TO, READ BACK BY AND VERIFIED WITH: FELICIA PREUDHOMME @0545  03/17/19 AKT    C Diff interpretation Toxin producing C. difficile detected.  Final    Comment: Performed at [facility], [address] [place], [place] [address]  CULTURE, BLOOD (ROUTINE X 2) w Reflex to ID Panel     Status: None (Preliminary result)   Collection Time: 03/18/19 10:38 AM  Result Value Ref Range Status   Specimen Description  BLOOD RIGHT HAND  Final   Special Requests   Final    BOTTLES DRAWN AEROBIC AND ANAEROBIC Blood Culture adequate volume   Culture   Final    NO GROWTH 4 DAYS Performed at [facility], [address] [place], [place] [address]    Report Status PENDING  Incomplete  CULTURE, BLOOD (ROUTINE X 2) w Reflex to ID Panel     Status: None (Preliminary result)   Collection Time: 03/18/19 10:38 AM  Result Value Ref Range Status   Specimen Description BLOOD RIGHT WRIST  Final   Special Requests   Final    BOTTLES DRAWN AEROBIC AND ANAEROBIC Blood Culture adequate volume    Culture   Final    NO GROWTH 4 DAYS Performed at [facility], [address] [place], [place] [address]    Report Status PENDING  Incomplete  Aerobic Culture (superficial specimen)     Status: None (Preliminary result)   Collection Time: 03/20/19 11:46 PM  Result Value Ref Range Status   Specimen Description   Final    TOE LEFT Performed at [facility], [address] [place], [place] [address]    Special Requests   Final    NONE Performed at [facility], [address] [place], [place] [address]    Gram Stain   Final    RARE WBC PRESENT, PREDOMINANTLY PMN RARE GRAM POSITIVE COCCI IN PAIRS    Culture   Final    CULTURE REINCUBATED FOR BETTER GROWTH Performed at [facility], [address] [place], [place] [address]    Report Status PENDING  Incomplete  Culture, respiratory (non-expectorated)     Status: None (Preliminary result)   Collection Time: 03/22/19  7:31 AM  Result Value Ref Range Status   Specimen Description   Final    TRACHEAL ASPIRATE Performed at [facility], [address] [place], [place] [address]    Special Requests   Final    NONE Performed at [facility], [address] [place], [place] [address]    Gram Stain   Final    RARE WBC PRESENT,BOTH PMN AND MONONUCLEAR FEW YEAST RARE SQUAMOUS EPITHELIAL CELLS PRESENT Performed at [facility], [address] [place], [place] [address]    Culture PENDING  Incomplete   Report Status PENDING  Incomplete    Best Practice/Protocols:  VTE Prophylaxis: Heparin (SQ) GI Prophylaxis: Antihistamine Continous Sedation  Events: 5/17 admitted for sepsis 5/18 C diff POS, MRSA +bacteremia, MRI c/w RT elbow OSTEO 5/19 DT's and Severe resp failure, ETT 7.5, aspiration pneumonia 5/20TEE negative 5/21 on vent and pressors, failed SAT/SBT CT chest RLL pneumonia,  5/21 CVL placed 5/22 worsening shock, multiple vasopressors  Studies: Dg Elbow 2  Views Left  Result Date: Nov 28, 2018 CLINICAL DATA:  Pain with movement EXAM: LEFT ELBOW - 2 VIEW COMPARISON:  None. FINDINGS: The joint effusion is identified with displacement of the anterior fat pad. No fractures is identified. No bony erosion or bony lesion. IMPRESSION: Joint effusion of uncertain etiology.  No bony abnormality noted. Electronically Signed   By: Gerome Samavid  Williams III M.D   On: 0Jan 30, 2020 13:00   Dg Elbow 2 Views Right  Result Date: Nov 28, 2018 CLINICAL DATA:  Possible sepsis. Shortness of breath. Numerous festering wounds. Open wound posterior right elbow and at medial surface of right ankle. Pain in left elbow with movement. EXAM: RIGHT ELBOW - 2 VIEW COMPARISON:  None. FINDINGS: There  is a wound over the olecranon. There is mild sclerosis in the underlying olecranon without identified bony erosion. There is suggestion of a possible small joint effusion. No fractures. IMPRESSION: 1. There is a deep wound posterior to the olecranon. Sclerosis in the underlying olecranon is identified without bony erosion. The sclerosis could represent sequela of previous osteomyelitis. An MRI could further assess if clinically warranted. 2. Suspected small joint effusion. 3. No fractures. Electronically Signed   By: Gerome Sam III M.D   On: April 08, 2019 12:56   Dg Ankle 2 Views Right  Result Date: 04/08/2019 CLINICAL DATA:  Evaluate for sepsis. EXAM: RIGHT ANKLE - 2 VIEW COMPARISON:  None. FINDINGS: No fracture or dislocation. The ankle mortise is intact. Reported skin defect over the medial surface of the right ankle is not visualized on this study. No bony erosion or evidence of osteomyelitis. IMPRESSION: No evidence of osteomyelitis. Electronically Signed   By: Gerome Sam III M.D   On: 08-Apr-2019 12:59   Dg Abd 1 View  Result Date: 03/18/2019 CLINICAL DATA:  Nasogastric tube placement. EXAM: ABDOMEN - 1 VIEW COMPARISON:  Radiographs Mar 17, 2019. FINDINGS: Mildly dilated small bowel loops  are noted in the left side of the abdomen which may represent ileus or possibly distal small bowel obstruction. Distal tip of nasogastric tube is seen in expected position of proximal stomach. Side hole appears to be just above gastroesophageal junction. IMPRESSION: Distal tip of nasogastric tube appears to be in proximal stomach with side hole just above gastroesophageal junction; advancement is recommended. Mildly dilated small bowel loops are noted concerning for ileus or possibly distal small bowel obstruction. Electronically Signed   By: Lupita Raider M.D.   On: 03/18/2019 08:10   Ct Head Wo Contrast  Result Date: 03/20/2019 CLINICAL DATA:  Lung cancer staging EXAM: CT HEAD WITHOUT CONTRAST TECHNIQUE: Contiguous axial images were obtained from the base of the skull through the vertex without intravenous contrast. COMPARISON:  None. FINDINGS: Brain: There is no mass, hemorrhage or extra-axial collection. The size and configuration of the ventricles and extra-axial CSF spaces are normal. There is hypoattenuation of the white matter, most commonly indicating chronic small vessel disease. Vascular: No abnormal hyperdensity of the major intracranial arteries or dural venous sinuses. No intracranial atherosclerosis. Skull: The visualized skull base, calvarium and extracranial soft tissues are normal. Sinuses/Orbits: No fluid levels or advanced mucosal thickening of the visualized paranasal sinuses. No mastoid or middle ear effusion. The orbits are normal. IMPRESSION: Mild chronic white matter small vessel ischemic changes without acute abnormality. No evidence for intracranial metastatic disease on this noncontrast examination. Electronically Signed   By: Deatra Robinson M.D.   On: 03/20/2019 15:49   Ct Head Wo Contrast  Result Date: 08-Apr-2019 CLINICAL DATA:  63 year old with unexplained acute mental status changes as the patient is unable to speak clearly. Patient also presents with respiratory distress and  has widespread abrasions. EXAM: CT HEAD WITHOUT CONTRAST TECHNIQUE: Contiguous axial images were obtained from the base of the skull through the vertex without intravenous contrast. COMPARISON:  None. FINDINGS: Brain: Ventricular system normal in size and appearance for age. Very small old lacunar stroke in the LEFT thalamus. Mild changes of small vessel disease of the white matter diffusely. No mass lesion. No midline shift. No acute hemorrhage or hematoma. No extra-axial fluid collections. No evidence of acute infarction. Vascular: Mild BILATERAL vertebral artery atherosclerosis. No hyperdense vessel. Skull: No skull fracture or other focal osseous abnormality involving the skull.  Sinuses/Orbits: Mucous retention cyst or polyp in the RIGHT maxillary sinus. Remaining visualized paranasal sinuses, BILATERAL mastoid air cells and BILATERAL middle ear cavities well-aerated. Visualized orbits and globes unremarkable. Other: None. IMPRESSION: 1. No acute intracranial abnormality. 2. Mild chronic microvascular ischemic changes of the white matter and very small old lacunar stroke in the left thalamus. 3. Mild chronic right maxillary sinus disease. Electronically Signed   By: Hulan Saas M.D.   On: 03/22/2019 13:35   Ct Chest Wo Contrast  Result Date: 03/20/2019 CLINICAL DATA:  Ventilator dependence.  Decreased breath sounds. EXAM: CT CHEST WITHOUT CONTRAST TECHNIQUE: Multidetector CT imaging of the chest was performed following the standard protocol without IV contrast. COMPARISON:  03/19/2019 chest x-ray FINDINGS: Cardiovascular: The heart size is normal. No substantial pericardial effusion. Coronary artery calcification is evident. Ascending thoracic aorta measures up to 5.5 cm diameter. Atherosclerotic calcification is noted in the wall of the thoracic aorta. Right central line tip is positioned in the mid SVC. Mediastinum/Nodes: Endotracheal tube tip visualized in the upper trachea. 11 mm short axis right  paratracheal lymph node is mildly enlarged. Hilar regions not well evaluated for lymphadenopathy given collapse/consolidative changes in the parahilar lungs. The esophagus has normal imaging features. There is no axillary lymphadenopathy. Lungs/Pleura: Interstitial and patchy airspace disease is identified in the posterior right upper lobe and right middle lobe with collapse/consolidation of the right lower lobe. Similar mild interstitial and patchy airspace opacity noted posterior left upper lobe with collapse/consolidative change in the left lower lobe. Small to moderate pleural effusions are noted bilaterally. Upper Abdomen: Perihepatic ascites noted in the upper abdomen. Stomach is distended with fluid. Aorta measures 5.2 cm in diameter at the level of the diaphragmatic hiatus. Musculoskeletal: No worrisome lytic or sclerotic osseous abnormality. Multiple nonacute right rib fractures are evident with possible acute nondisplaced fracture of the lateral right fifth rib (91/2). Mild superior endplate compression deformity noted at T5. Patient is status post cervicothoracic fusion. IMPRESSION: 1. Bilateral lower lobe collapse/consolidation with small to moderate bilateral pleural effusions. Interstitial and patchy airspace disease noted in both upper lobes. Imaging features compatible with multifocal pneumonia. 2. Mild right paratracheal lymphadenopathy is likely reactive. 3. Ascending thoracic aorta measures 5.5 cm diameter. Descending aorta measures 5.2 cm diameter at the level of the diaphragmatic hiatus. Recommend semi-annual imaging followup by CTA or MRA and referral to cardiothoracic surgery if not already obtained. This recommendation follows 2010 ACCF/AHA/AATS/ACR/ASA/SCA/SCAI/SIR/STS/SVM Guidelines for the Diagnosis and Management of Patients With Thoracic Aortic Disease. Circulation. 2010; 121: E266-e369TAA. Aortic aneurysm NOS (ICD10-I71.9) Electronically Signed   By: Kennith Center M.D.   On: 03/20/2019  16:10   US Renal  Result Date: 03/17/2019 CLINICAL DATA:  Acute kidney injury EXAM: RENAL / URINARY TRACT ULTRASOUND COMPLETE COMPARISON:  None. FINDINGS: Right Kidney: Renal measurements: 10.6 x 4.7 x 4.4 cm = volume: 114 mL . Echogenicity within normal limits. No mass or hydronephrosis visualized. 15 mm right upper pole cyst. Left Kidney: Renal measurements: 9.9 x 5.3 by 4.7 cm = volume: 127 mL. Echogenicity within normal limits. No mass or hydronephrosis visualized. Bladder: Diffuse bladder wall thickening, mild. Ascites around the liver IMPRESSION: Negative for renal mass or obstruction.  Bladder wall thickening Ascites Electronically Signed   By: Marlan Palau M.D.   On: 03/17/2019 13:44   Mr Elbow Right Wo Contrast  Result Date: 03/17/2019 CLINICAL DATA:  Wound overlying the olecranon process. Evaluate for osteomyelitis. EXAM: MRI OF THE RIGHT ELBOW WITHOUT CONTRAST TECHNIQUE: Multiplanar, multisequence MR  imaging of the elbow was performed. No intravenous contrast was administered. COMPARISON:  None. FINDINGS: TENDONS Common forearm flexor origin: Intact. Common forearm extensor origin: Mild tendinosis of the common extensor tendon origin. Biceps: Intact. Triceps: Intact. LIGAMENTS Medial stabilizers: Intact. Lateral stabilizers:  Intact. Cartilage: Small focal area of cartilage loss involving the ulnar aspect of the radial head with subchondral reactive marrow changes. Joint: Large elbow joint effusion. Cubital tunnel: Normal. Bones: Large soft tissue wound overlying the olecranon process extending to the cortex. Single focal area of cortical irregularity and subcortical marrow edema involving the posterior olecranon on (image 14/series 7) concerning for an area of reactive marrow edema versus mild early osteomyelitis. 9.2 x 15 mm fluid collection along the posterior superficial aspect of the triceps tendon which extends through the triceps tendon into the posterior fat pad with edema throughout the  posterior fat pad. IMPRESSION: 1. Large soft tissue wound overlying the olecranon process extending to the cortex. Single focal area of cortical irregularity and subcortical marrow edema involving the posterior olecranon on (image 14/series 7) concerning for an area of reactive marrow edema versus mild early osteomyelitis. 9.2 x 15 mm fluid collection along the posterior superficial aspect of the triceps tendon which extends through the triceps tendon into the posterior fat pad with edema throughout the posterior fat pad concerning for an abscess. Large elbow joint effusion which may be reactive, but is most concerning for septic arthritis. Electronically Signed   By: Elige Ko   On: 03/17/2019 13:50   Dg Chest Port 1 View  Result Date: 03/22/2019 CLINICAL DATA:  Acute respiratory failure EXAM: PORTABLE CHEST 1 VIEW COMPARISON:  03/21/2019 FINDINGS: Cardiac shadows within normal limits. Endotracheal tube is noted in satisfactory position. Right jugular central line is again seen and stable. Feeding catheter is noted extending into the distal esophagus. It is coiled several times within the neck. Withdrawal and readvancement is recommended. Diffuse bilateral infiltrates and effusions are again seen and stable. No pneumothorax is seen. No bony abnormality is noted. Postsurgical changes in the cervical spine are seen. IMPRESSION: Overall stable appearance of the chest. The weighted feeding catheter is noted within the distal esophagus but coiled several times within the neck. Withdrawal in readvancement is recommended. Electronically Signed   By: Alcide Clever M.D.   On: 03/22/2019 03:57   Dg Chest Port 1 View  Result Date: 03/21/2019 CLINICAL DATA:  ET tube placement EXAM: PORTABLE CHEST 1 VIEW COMPARISON:  03/19/2019 FINDINGS: Interval advancement of endotracheal tube, tip now projecting over the mid trachea. Weighted enteric feeding tube is positioned with tip above the diaphragm. Metallic stylette is not  in position. Unchanged position of right neck vascular catheter, tip over the lower SVC. No significant change in diffuse bilateral interstitial pulmonary opacity and layering bilateral pleural effusions. IMPRESSION: 1. Interval advancement of endotracheal tube, tip now projecting over the mid trachea. 2. Weighted enteric feeding tube is positioned with tip above the diaphragm. Metallic stylette is not in position. Recommend advancement. 3. Unchanged position of right neck vascular catheter, tip over the lower SVC. 4. No significant change in diffuse bilateral interstitial pulmonary opacity and layering bilateral pleural effusions. Electronically Signed   By: Lauralyn Primes M.D.   On: 03/21/2019 14:49   Dg Chest Port 1 View  Result Date: 03/20/2019 CLINICAL DATA:  63 year old male central line placement. EXAM: PORTABLE CHEST 1 VIEW COMPARISON:  0302 hours the same day and earlier. FINDINGS: Portable AP supine view at 2342 hours. Endotracheal tube tip  at the level the clavicles. Enteric tube courses to the abdomen, tip not included. New right IJ central line in place, tip at or just below the level of the carina. No pneumothorax. Stable lung volumes. Stable mediastinal contours allowing for differences in rotation. Veiling opacity at the right lung base has increased since 03/28/19. Pulmonary vascularity has increased. Extensive previous posterior cervical and upper thoracic spine fusion. IMPRESSION: 1. Right IJ central line placed, tip at the SVC level. No pneumothorax. 2. ETT tip at the level the clavicles. Enteric tube courses to the abdomen. 3. Suspect increasing right pleural effusion since 2019-03-28, and borderline to mild interstitial edema. Electronically Signed   By: Odessa Fleming M.D.   On: 03/20/2019 00:08   Dg Chest Port 1 View  Result Date: 03/19/2019 CLINICAL DATA:  63 y/o  M; acute respiratory failure. EXAM: PORTABLE CHEST 1 VIEW COMPARISON:  03/18/2019 chest radiograph. FINDINGS: Stable cardiac  silhouette and uncoiled aorta given oblique technique. Endotracheal tube tip projects 6.5 cm above the carina. Enteric tube tip extends below the field of view into the abdomen. Increasing basilar infiltrates greater on the left. Possible small effusions. No pneumothorax. Cervical fusion hardware, partially visualized. No acute osseous abnormality is evident. IMPRESSION: Stable support apparatus. Increasing basilar infiltrates greater on the left. Possible small effusions. Electronically Signed   By: Mitzi Hansen M.D.   On: 03/19/2019 03:58   Dg Chest Port 1 View  Result Date: 03/18/2019 CLINICAL DATA:  Status post intubation. EXAM: PORTABLE CHEST 1 VIEW COMPARISON:  Radiograph of 03-28-2019. FINDINGS: Stable cardiomediastinal silhouette. Endotracheal and nasogastric tubes are in grossly good position. No pneumothorax is noted. Left lung is clear. Minimal right basilar subsegmental atelectasis or infiltrate is noted. Bony thorax is unremarkable. IMPRESSION: Endotracheal and nasogastric tubes are in grossly good position. Minimal right basilar subsegmental atelectasis or infiltrate. Electronically Signed   By: Lupita Raider M.D.   On: 03/18/2019 08:09   Dg Chest Port 1 View  Result Date: 03/28/19 CLINICAL DATA:  Sepsis. EXAM: PORTABLE CHEST 1 VIEW COMPARISON:  None FINDINGS: The thoracic aorta is prominent tortuous. The heart, hila, and mediastinum are unremarkable. The left lung is clear. Mild opacity in the right mid lung. Mild atelectasis in the right base. No other acute abnormalities. IMPRESSION: 1. Possible mild opacity in the right mid lung could represent subtle pneumonia. A better volume PA and lateral chest x-ray may better evaluate. 2. Prominent tortuous thoracic aorta. Electronically Signed   By: Gerome Sam III M.D   On: 2019/03/28 12:58   Dg Abd Portable 1v  Result Date: 03/19/2019 CLINICAL DATA:  63 year old male tube placement. EXAM: PORTABLE ABDOMEN - 1 VIEW  COMPARISON:  1535 hours today. FINDINGS: Portable AP semi upright view at 1749 hours. The NG tube has been removed and enteric feeding tube has been placed. The tip projects in the medial lower abdomen, likely within the distal aspect of a ptotic stomach. There probably is adequate slack to allow for post pyloric transit. Otherwise stable abdomen and visible lower chest. IMPRESSION: Feeding tube placed, tip likely in the distal aspect of a ptotic stomach. Electronically Signed   By: Odessa Fleming M.D.   On: 03/19/2019 19:59   Dg Abd Portable 1v  Result Date: 03/19/2019 CLINICAL DATA:  OG tube placement EXAM: PORTABLE ABDOMEN - 1 VIEW COMPARISON:  Earlier same day; 03/18/2019 FINDINGS: Interval advancement of enteric tube with side port now projected the expected location of the GE junction, similar to the  02/2018 examination. Gaseous distention of the colon and cecum. No pneumoperitoneum, pneumatosis or portal venous gas. Limited visualization of the lower thorax demonstrates suspected trace bilateral effusions with associated bibasilar opacities. Mild scoliotic curvature of the thoracolumbar spine, potentially positional. IMPRESSION: Interval advancement of enteric tube with side port now projected over the expected location of the GE junction. Advancement an additional 8 cm could be performed as indicated. Electronically Signed   By: Simonne Come M.D.   On: 03/19/2019 16:11   Dg Abd Portable 1v  Result Date: 03/19/2019 CLINICAL DATA:  Ileus. EXAM: PORTABLE ABDOMEN - 1 VIEW COMPARISON:  03/18/2019 FINDINGS: There is gaseous distention of multiple loops of small bowel and colon. There is a relative paucity of bowel gas at the level of the rectum. The nasogastric tube is suboptimally positioned in the esophagus. Bibasilar airspace opacities are noted IMPRESSION: 1. Nasogastric tube in the esophagus.  Repositioning is recommended. 2. Persistent mildly dilated loops of small bowel. 3. Bibasilar airspace opacities.  Consider dedicated chest radiograph for further evaluation. Electronically Signed   By: Katherine Mantle M.D.   On: 03/19/2019 13:41   Dg Abd Portable 1v  Result Date: 03/17/2019 CLINICAL DATA:  Screening for metallic foreign bodies prior to MRI EXAM: PORTABLE ABDOMEN - 1 VIEW COMPARISON:  None. FINDINGS: The bowel gas pattern is normal. No radio-opaque calculi or other significant radiographic abnormality are seen. No definitive metallic foreign body is noted. Degenerative changes of lumbar spine are noted. IMPRESSION: No definitive metallic foreign body is identified. Electronically Signed   By: Alcide Clever M.D.   On: 03/17/2019 10:08    Consults: Treatment Team:  Pccm, Raymond Gurney, MD Kennedy Bucker, MD   Subjective:    Overnight Issues:   Objective:  Vital signs for last 24 hours: Temp:  [97.5 F (36.4 C)-97.8 F (36.6 C)] 97.6 F (36.4 C) (05/23 2000) Pulse Rate:  [69-132] 121 (05/23 1900) Resp:  [11-23] 23 (05/23 2100) BP: (68-122)/(31-65) 107/54 (05/23 2030) SpO2:  [82 %-99 %] 96 % (05/23 2000) FiO2 (%):  [35 %-70 %] 70 % (05/23 2000) Weight:  [83.9 kg] 83.9 kg (05/23 0433)  Hemodynamic parameters for last 24 hours: CVP:  [9 mmHg-16 mmHg] 9 mmHg  Intake/Output from previous day: 05/22 0701 - 05/23 0700 In: 6003.9 [I.V.:4018.8; NG/GT:885; IV Piggyback:1100.1] Out: 475 [Urine:425; Drains:50]  Intake/Output this shift: Total I/O In: 214.5 [I.V.:169.5; NG/GT:45] Out: -   Vent settings for last 24 hours: Vent Mode: PRVC FiO2 (%):  [35 %-70 %] 70 % Set Rate:  [15 bmp-20 bmp] 20 bmp Vt Set:  [450 mL] 450 mL PEEP:  [5 cmH20] 5 cmH20 Plateau Pressure:  [10 cmH20-15 cmH20] 10 cmH20  Physical Exam:  GENERAL:critically ill appearing, synchronous with vent HEAD: Normocephalic, atraumatic. Bitemporal wasting. EYES: Pupils equal, round, reactive to light.  No scleral icterus.  MOUTH: Moist mucosal membranes.Poor dentition.ETT  @ place NECK: Supple. No thyromegaly. No  JVD.  PULMONARY: +rhonchi-scattered, no wheezing noted.   CARDIOVASCULAR: S1 and S2. Regular rate and rhythm. No murmurs, rubs, or gallop.  GASTROINTESTINAL: Soft distended abdomen. Positive bowel sounds. No hepatosplenomegaly.  Query ascites. MUSCULOSKELETAL: 2-3+ anasarca.   NEUROLOGIC: obtunded/sedated and requires sedation due to ventilator asynchrony SKIN: Livedo reticularis, multiple hemorrhagic/necrotic purpuric lesions throughout the body mostly at the feet fingers tip of nose   Assessment/Plan:   1.  Acute hypoxic/hypercapnic respiratory failure, ventilator dependent: Continues to require full ventilator support.  Cannot perform SBT due to ventilator asynchrony when sedation is lightened.  Continue pulmonary toilet, bronchodilator therapy.  Continue attempts at SAT/SBT as tolerated.  Ever, patient appears not to be improving despite aggressive therapy and his prognosis is exceedingly poor.  2.  Severe sepsis/MRSA bacteremia complicated by C. difficile: Initially right elbow septic arthritis/osteo-.  Patient also has type B aortic dissection that required surgery however he never followed through with this.  CT findings suggestive of a thrombosed false lumen versus acute intramural hematoma from lower descending aorta to the level of the infrarenal aorta.  Suspect that the thrombus has infected.  Skin lesions are consistent with cholesterol/infectious embolization.  Continue antibiotics, currently on vancomycin and metronidazole for prior C. difficile.  Prognosis is exceedingly poor as the patient cannot only undergo source control if indeed he has infected thrombus.   3.  Persistent septic shock requiring multiple pressors, multiorgan failure secondary to the same: Poor prognostic sign that he is not improving his septic physiology.  Continue supportive care.  4.  Encephalopathy due to all of the above: Patient still requiring heavy sedation due to ventilator asynchrony when this is not  done.  Continue SATs as tolerated.  Continue to decrease sedatives to the least necessary.  Encephalopathy is complicated by severe DTs which the patient underwent early and has hospitalization.  5.  Anasarca due to severe protein calorie malnutrition: Albumin 1.1.  This is terrible prognostic sign.  Continue attempts for nutrition.  6. COVID-19 negative: COVID ruled out.  7.  Marfan syndrome, adult failure to thrive: These issues at complexity to his management.  8.  Audible skin lesions: Suspect cholesterol/infectious emboli.    LOS: 6 days   Additional comments: Discussed in detail with the patient's friend Mr. Janie Morning who the patient had also deferred medical decision making to. Mr. Ander Gaster has attempted to find out if the patient has any family.  He has never seen family with the patient and has never heard the patient mention family. Review of the [facility]'s [facility] records also show that no family is ever mentioned.  Mr. Ander Gaster states that the patient would have never wanted to be on a ventilator.  I have discussed the case with Dr. Enedina Finner and we are both in agreement that a CODE BLUE situation in this patient's case would be disastrous given the patient's comorbidities and current state.  Continues to show evidence of persistent septic shock.  Feeling confident that the patient's wishes are being accurately represented and that he would not have wanted prolongation of life when recovery would be unlikely the patient will be made DNR.  We will continue the current level of care however, will not escalate care any further.  We will ask palliative care to assist with transition to comfort care if he fails to improve over the next 24 to 48 hours.  Critical Care Total Time*: 45 Minutes  C. Danice Goltz, MD [facility] 03/22/2019  *Care during the described time interval was provided by me and/or other providers on the critical care team.  I have reviewed this patient's available data,  including medical history, events of note, physical examination and test results as part of my evaluation.

## 2019-03-22 NOTE — Progress Notes (Signed)
Wound VAC was changed yesterday with some grand healthy granulation tissue present although there is still persistent drainage.  We will continue with wound VAC change every 3 to 4 days over right elbow.

## 2019-03-22 NOTE — Progress Notes (Signed)
Pharmacy Antibiotic Note  Leonard Peters is a 63 y.o. male admitted on 03/24/2019 with multiple infections. Patient with recent history of drug abuse and presents with clostridium difficile, MRSA bacteremia, cellulitis, UTI, and possible pneumonia. Patient with left heart catheterization and clostridium difficile infection in March 2020. Pharmacy has been consulted for vancomycin dosing.  Tee negative for endocarditis.  Plan:  Patient continues to decline. Now on 3 pressers and stress dose steroids. Serum creatinine continuing to improve but urine output remains minimal.   Continue vancomycin to 1500 mg IV q48hr. Will plan to get random vancomycin level on 5/24.    Vancomycin 500mg  PR Q6hr s/t ileus/SBO.   Height: 6\' 3"  (190.5 cm) Weight: 184 lb 15.5 oz (83.9 kg) IBW/kg (Calculated) : 84.5  Temp (24hrs), Avg:98.1 F (36.7 C), Min:97.6 F (36.4 C), Max:99 F (37.2 C)  Recent Labs  Lab 03/08/2019 1216 03/26/2019 1502 03/24/2019 1938  03/18/19 0422 03/19/19 0243 03/20/19 0425 03/21/19 0530 03/22/19 0439  WBC 25.6*  --   --    < > 21.6* 19.6* 19.2* 11.3* 7.5  CREATININE 4.60*  --  4.28*   < > 2.77* 2.32* 2.36* 2.15* 2.00*  LATICACIDVEN >11.0* 5.7* 1.9  --   --   --   --   --  2.6*  VANCORANDOM  --   --   --   --   --  16  --   --   --    < > = values in this interval not displayed.    Estimated Creatinine Clearance: 44.9 mL/min (A) (by C-G formula based on SCr of 2 mg/dL (H)).    Allergies  Allergen Reactions  . Lipitor [Atorvastatin Calcium]     Elevated LFTs    Antimicrobials this admission: Cefepime 5/17 x 1 Metronidazole 5/17, 5/18 >> 5/20, 5/22 x 1  Vancomycin IV 5/17 >>  Vancomycin PO 5/18 >> 5/18, PR 5/18 >> 5/19, PO 5/19 >> Ceftriaxone 5/18 x 1  Doxycycline 5/18 x 1   Dose adjustments this admission: 5/18 Vancomycin PO adjusted to 500mg  PO Q6hr.  5/19 Vancomycin PO adjusted to PR  5/20 Vanc IV 1000 mg q48h >> IV 1500 mg q48h  Microbiology results: 5/17 BCx:  4/4 bottles MRSA  5/17 UCx: >100K MRSA  5/17 MRSA PCR: positive  5/17 COVID: negative  5/18 Clostridium Difficile: positive 5/19 BCx: no growth x 4 days  5/21 Left Toe: pending   Thank you for allowing pharmacy to be a part of this patient's care.  Kingston Shawgo L, PharmD 03/22/2019 11:00 AM

## 2019-03-22 NOTE — Progress Notes (Signed)
Pharmacy Electrolyte Monitoring Consult:  Pharmacy consulted to assist in monitoring and replacing electrolytes in this 63 y.o. male admitted on 03/29/2019 with Altered Mental Status   Labs:  Sodium (mmol/L)  Date Value  03/22/2019 138   Potassium (mmol/L)  Date Value  03/22/2019 4.1   Magnesium (mg/dL)  Date Value  41/28/7867 1.6 (L)   Phosphorus (mg/dL)  Date Value  67/20/9470 3.4   Calcium (mg/dL)  Date Value  96/28/3662 7.7 (L)   Albumin (g/dL)  Date Value  94/76/5465 1.1 (L)   Corrected calcium: 9.4  Assessment/Plan: Magnesium 4g IV x 1.   Will check all electrolytes with am labs.   Will replace for goal potassium ~ 4 and goal magnesium ~ 2.   Pharmacy will continue to monitor and adjust per consult.   Simpson,Michael L 03/22/2019 11:02 AM

## 2019-03-22 NOTE — Progress Notes (Signed)
[facility] - [place] at [facility]   PATIENT NAME: Jaquelyn BitterRichard Zanni    MR#:  [id number]  DATE OF BIRTH:  08/10/1956  SUBJECTIVE:  patient remains intubated on the vent. He is critically ill. On three IV pressers. Not amenable from the vent.  REVIEW OF SYSTEMS:    ROS Unable to obtain as patient is critically ill DRUG ALLERGIES:   Allergies  Allergen Reactions  . Lipitor [Atorvastatin Calcium]     Elevated LFTs    VITALS:  Blood pressure 115/64, pulse (!) 118, temperature (!) 97.5 F (36.4 C), temperature source Axillary, resp. rate 15, height 6\' 3"  (1.905 m), weight 83.9 kg, SpO2 94 %.  PHYSICAL EXAMINATION:   Physical Exam limited exam GENERAL:  63 y.o.-year-old patient lying in the bed on ventilator EYES: Pupils equal, round, reactive to light and accommodation. No scleral icterus. Extraocular muscles intact.  HEENT: Head atraumatic, normocephalic. Oropharynx and nasopharynx clear.  NECK:  Supple, no jugular venous distention. No thyroid enlargement, no tenderness.  LUNGS: Decreased breath sounds bilaterally, rales heard in both lungs CARDIOVASCULAR: S1, S2 normal. No murmurs, rubs, or gallops.  ABDOMEN: Soft, nontender, nondistended. Bowel sounds present. No organomegaly or mass.  EXTREMITIES:           NEUROLOGIC: On ventilator could not be assessed PSYCHIATRIC: on the ventilator SKIN: Has multiple red lesions over extremities  LABORATORY PANEL:   CBC Recent Labs  Lab 03/22/19 0439  WBC 7.5  HGB 7.9*  HCT 26.3*  PLT 55*   ------------------------------------------------------------------------------------------------------------------ Chemistries  Recent Labs  Lab 03/22/19 0439  NA 138  K 4.1  CL 106  CO2 24  GLUCOSE 90  BUN 65*  CREATININE 2.00*  CALCIUM 7.7*  MG 1.6*  AST 18  ALT 14  ALKPHOS 134*  BILITOT 0.4    ------------------------------------------------------------------------------------------------------------------  Cardiac Enzymes No results for input(s): TROPONINI in the last 168 hours. ------------------------------------------------------------------------------------------------------------------  RADIOLOGY:  Ct Head Wo Contrast  Result Date: 03/20/2019 CLINICAL DATA:  Lung cancer staging EXAM: CT HEAD WITHOUT CONTRAST TECHNIQUE: Contiguous axial images were obtained from the base of the skull through the vertex without intravenous contrast. COMPARISON:  None. FINDINGS: Brain: There is no mass, hemorrhage or extra-axial collection. The size and configuration of the ventricles and extra-axial CSF spaces are normal. There is hypoattenuation of the white matter, most commonly indicating chronic small vessel disease. Vascular: No abnormal hyperdensity of the major intracranial arteries or dural venous sinuses. No intracranial atherosclerosis. Skull: The visualized skull base, calvarium and extracranial soft tissues are normal. Sinuses/Orbits: No fluid levels or advanced mucosal thickening of the visualized paranasal sinuses. No mastoid or middle ear effusion. The orbits are normal. IMPRESSION: Mild chronic white matter small vessel ischemic changes without acute abnormality. No evidence for intracranial metastatic disease on this noncontrast examination. Electronically Signed   By: Deatra RobinsonKevin  Herman M.D.   On: 03/20/2019 15:49   Ct Chest Wo Contrast  Result Date: 03/20/2019 CLINICAL DATA:  Ventilator dependence.  Decreased breath sounds. EXAM: CT CHEST WITHOUT CONTRAST TECHNIQUE: Multidetector CT imaging of the chest was performed following the standard protocol without IV contrast. COMPARISON:  03/19/2019 chest x-ray FINDINGS: Cardiovascular: The heart size is normal. No substantial pericardial effusion. Coronary artery calcification is evident. Ascending thoracic aorta measures up to 5.5 cm diameter.  Atherosclerotic calcification is noted in the wall of the thoracic aorta. Right central line tip is positioned in the mid SVC. Mediastinum/Nodes: Endotracheal tube tip visualized in the upper trachea. 11 mm short axis  right paratracheal lymph node is mildly enlarged. Hilar regions not well evaluated for lymphadenopathy given collapse/consolidative changes in the parahilar lungs. The esophagus has normal imaging features. There is no axillary lymphadenopathy. Lungs/Pleura: Interstitial and patchy airspace disease is identified in the posterior right upper lobe and right middle lobe with collapse/consolidation of the right lower lobe. Similar mild interstitial and patchy airspace opacity noted posterior left upper lobe with collapse/consolidative change in the left lower lobe. Small to moderate pleural effusions are noted bilaterally. Upper Abdomen: Perihepatic ascites noted in the upper abdomen. Stomach is distended with fluid. Aorta measures 5.2 cm in diameter at the level of the diaphragmatic hiatus. Musculoskeletal: No worrisome lytic or sclerotic osseous abnormality. Multiple nonacute right rib fractures are evident with possible acute nondisplaced fracture of the lateral right fifth rib (91/2). Mild superior endplate compression deformity noted at T5. Patient is status post cervicothoracic fusion. IMPRESSION: 1. Bilateral lower lobe collapse/consolidation with small to moderate bilateral pleural effusions. Interstitial and patchy airspace disease noted in both upper lobes. Imaging features compatible with multifocal pneumonia. 2. Mild right paratracheal lymphadenopathy is likely reactive. 3. Ascending thoracic aorta measures 5.5 cm diameter. Descending aorta measures 5.2 cm diameter at the level of the diaphragmatic hiatus. Recommend semi-annual imaging followup by CTA or MRA and referral to cardiothoracic surgery if not already obtained. This recommendation follows 2010  ACCF/AHA/AATS/ACR/ASA/SCA/SCAI/SIR/STS/SVM Guidelines for the Diagnosis and Management of Patients With Thoracic Aortic Disease. Circulation. 2010; 121: E266-e369TAA. Aortic aneurysm NOS (ICD10-I71.9) Electronically Signed   By: Kennith Center M.D.   On: 03/20/2019 16:10   Dg Chest Port 1 View  Result Date: 03/22/2019 CLINICAL DATA:  Acute respiratory failure EXAM: PORTABLE CHEST 1 VIEW COMPARISON:  03/21/2019 FINDINGS: Cardiac shadows within normal limits. Endotracheal tube is noted in satisfactory position. Right jugular central line is again seen and stable. Feeding catheter is noted extending into the distal esophagus. It is coiled several times within the neck. Withdrawal and readvancement is recommended. Diffuse bilateral infiltrates and effusions are again seen and stable. No pneumothorax is seen. No bony abnormality is noted. Postsurgical changes in the cervical spine are seen. IMPRESSION: Overall stable appearance of the chest. The weighted feeding catheter is noted within the distal esophagus but coiled several times within the neck. Withdrawal in readvancement is recommended. Electronically Signed   By: Alcide Clever M.D.   On: 03/22/2019 03:57   Dg Chest Port 1 View  Result Date: 03/21/2019 CLINICAL DATA:  ET tube placement EXAM: PORTABLE CHEST 1 VIEW COMPARISON:  03/19/2019 FINDINGS: Interval advancement of endotracheal tube, tip now projecting over the mid trachea. Weighted enteric feeding tube is positioned with tip above the diaphragm. Metallic stylette is not in position. Unchanged position of right neck vascular catheter, tip over the lower SVC. No significant change in diffuse bilateral interstitial pulmonary opacity and layering bilateral pleural effusions. IMPRESSION: 1. Interval advancement of endotracheal tube, tip now projecting over the mid trachea. 2. Weighted enteric feeding tube is positioned with tip above the diaphragm. Metallic stylette is not in position. Recommend advancement.  3. Unchanged position of right neck vascular catheter, tip over the lower SVC. 4. No significant change in diffuse bilateral interstitial pulmonary opacity and layering bilateral pleural effusions. Electronically Signed   By: Lauralyn Primes M.D.   On: 03/21/2019 14:49     ASSESSMENT AND PLAN:  63 year old male patient currently in the ICU for sepsis  -Acute hypoxic respiratory failure Continue mechanical ventilation Intensivist follow-up Weaning trials when appropriate-- currently not readable  -  Sepsis severe with hypovolemia and shock IV fluids Broad-spectrum antibiotics Follow-up cultures negative so far IV pressors : vasopressin, levophed and neosynephrine Stress dose steroid -TEE negative for endocarditis  -Acute Metabolic Encephalopathy Supprotive care  -MRSA bacteremia Continue broad-spectrum antibiotics TEE negative for endocarditis COVID test negative Infectious disease follow-up appreciated  -Type B aortic dissection S/p vascular surgery evaluation  -Right lung pneumonia with opacity Continue IV antibiotics CT chest down the line  -Acute kidney injury Avoid nephrotoxic drugs IV fluids and monitor renal function  -Right elbow cellulitis Follow-up MRI to check for any osteomyelitis  -Adult failure to thrive Nutritional supplements  -C. difficile colitis Contact and enteric precautions Continue Flagyl and vancomycin Switch to oral meds once tolerated  -Hypotension secondary to sepsis IV fluids and IV pressors as needed  -Acute systolic heart failure Lasix as needed Cardiology recommendations  -Right elbow osteomyelitis Status post orthopedic evaluation Wound VAC recommended per Dr. Rosita Kea  -Palliative care on board  -Guarded/Grave  prognosis currently patient does not have any family members. Will get care management and social worker involved  CODE STATUS: Full code  DVT Prophylaxis: SCDs  TOTAL TIME TAKING CARE OF THIS PATIENT: 35 minutes.    POSSIBLE D/C IN 2 to 3 DAYS, DEPENDING ON CLINICAL CONDITION.  Enedina Finner M.D on 03/22/2019 at 3:00 PM  Between 7am to 6pm - Pager - [phone]   After 6pm go to [email or web address] - password [email or web address]  [facility]  Office  [phone]  CC: Primary care physician; System, Pcp Not In  Note: This dictation was prepared with Dragon dictation along with smaller phrase technology. Any transcriptional errors that result from this process are unintentional.

## 2019-03-22 NOTE — Progress Notes (Signed)
Have been communicating with RT regarding patient decreasing sats and increased secretions. ELINK camera into room and inquired about sats that has been monitored by staff. Suggested calling RT to increased FiO2 which was increased from 35 to 40%. RT was notified and increased to 50%. Informed NP and NP had FiO2 increased to 65%, and ordered stat abg, CXR, Chest PT, and duoneb to be administered now. Sats up to 90s presently. RT in room, continue to monitor.

## 2019-03-23 LAB — AEROBIC CULTURE W GRAM STAIN (SUPERFICIAL SPECIMEN): Culture: NORMAL

## 2019-03-23 LAB — CULTURE, BLOOD (ROUTINE X 2)
Culture: NO GROWTH
Culture: NO GROWTH
Special Requests: ADEQUATE
Special Requests: ADEQUATE

## 2019-03-23 LAB — GLUCOSE, CAPILLARY: Glucose-Capillary: 43 mg/dL — CL (ref 70–99)

## 2019-03-23 MED ORDER — DEXTROSE 10 % IV SOLN
INTRAVENOUS | Status: DC
  Administered 2019-03-23: 01:00:00 via INTRAVENOUS

## 2019-03-25 LAB — CULTURE, RESPIRATORY W GRAM STAIN

## 2019-03-31 ENCOUNTER — Telehealth: Payer: Self-pay | Admitting: Pulmonary Disease

## 2019-03-31 NOTE — Death Summary Note (Signed)
DEATH SUMMARY   Patient Details  Name: Leonard Peters MRN: [id number] DOB: 02-23-1956  Admission/Discharge Information   Admit Date:  04/04/2019  Date of Death: Date of Death: 2019-04-11  Time of Death: Time of Death: 0110  Length of Stay: 7  Referring Physician: System, Pcp Not In   Reason(s) for Hospitalization  Acute hypoxic respiratory failure and severe MRSA SEPSIS  Diagnoses  Preliminary cause of death:  Secondary Diagnoses (including complications and co-morbidities):  Active Problems:   Sepsis (HCC)   Pressure injury of skin   Severe sepsis with acute organ dysfunction (HCC)   C. difficile colitis   MRSA bacteremia   Palliative care by specialist   Goals of care, counseling/discussion   AKI (acute kidney injury) (HCC)   Acute respiratory failure (HCC)   Encounter for central line placement   Brief Hospital Course (including significant findings, care, treatment, and services provided and events leading to death)  Leonard Peters is a 63 y.o. year old male who who presented to the ED via EMS with acute respiratory distress, severe malnutrition/cachetic and confusion. He was subsequently developed acute hypoxic respiratory failure requiring mechanical ventilation.  His stool culture was positive for C. difficile and blood cultures were positive for MRSA.  His chest x-ray showed right lung pneumonia and his labs were consistent with severe malnutrition.  He also had multiple lesions and right elbow cellulitis with concern for osteomyelitis.  He was treated with broad-spectrum antibiotics and antibiotics adjusted based on culture results.  He was also noted to have purulent discharge from both ears and had multiple.  Despite treatment, patient proceeded to develop extensive multiorgan failure with severe refractory shock requiring multiple pressors.  In consultation with his POA, the decision was made for patient to be DNR.  This morning, patient's heart rate dropped and he went into  asystole.  Patient was pronounced at 1:10 AM.  POA notified.  Pertinent Labs and Studies  Significant Diagnostic Studies Dg Elbow 2 Views Left  Result Date: April 04, 2019 CLINICAL DATA:  Pain with movement EXAM: LEFT ELBOW - 2 VIEW COMPARISON:  None. FINDINGS: The joint effusion is identified with displacement of the anterior fat pad. No fractures is identified. No bony erosion or bony lesion. IMPRESSION: Joint effusion of uncertain etiology.  No bony abnormality noted. Electronically Signed   By: Gerome Sam III M.D   On: 04-04-19 13:00   Dg Elbow 2 Views Right  Result Date: 04-04-2019 CLINICAL DATA:  Possible sepsis. Shortness of breath. Numerous festering wounds. Open wound posterior right elbow and at medial surface of right ankle. Pain in left elbow with movement. EXAM: RIGHT ELBOW - 2 VIEW COMPARISON:  None. FINDINGS: There is a wound over the olecranon. There is mild sclerosis in the underlying olecranon without identified bony erosion. There is suggestion of a possible small joint effusion. No fractures. IMPRESSION: 1. There is a deep wound posterior to the olecranon. Sclerosis in the underlying olecranon is identified without bony erosion. The sclerosis could represent sequela of previous osteomyelitis. An MRI could further assess if clinically warranted. 2. Suspected small joint effusion. 3. No fractures. Electronically Signed   By: Gerome Sam III M.D   On: 04/04/19 12:56   Dg Ankle 2 Views Right  Result Date: 04/04/2019 CLINICAL DATA:  Evaluate for sepsis. EXAM: RIGHT ANKLE - 2 VIEW COMPARISON:  None. FINDINGS: No fracture or dislocation. The ankle mortise is intact. Reported skin defect over the medial surface of the right ankle is not visualized on  this study. No bony erosion or evidence of osteomyelitis. IMPRESSION: No evidence of osteomyelitis. Electronically Signed   By: Gerome Samavid  Williams III M.D   On: 06/29/19 12:59   Dg Abd 1 View  Result Date: 03/18/2019 CLINICAL DATA:   Nasogastric tube placement. EXAM: ABDOMEN - 1 VIEW COMPARISON:  Radiographs Mar 17, 2019. FINDINGS: Mildly dilated small bowel loops are noted in the left side of the abdomen which may represent ileus or possibly distal small bowel obstruction. Distal tip of nasogastric tube is seen in expected position of proximal stomach. Side hole appears to be just above gastroesophageal junction. IMPRESSION: Distal tip of nasogastric tube appears to be in proximal stomach with side hole just above gastroesophageal junction; advancement is recommended. Mildly dilated small bowel loops are noted concerning for ileus or possibly distal small bowel obstruction. Electronically Signed   By: Lupita RaiderJames  Green Jr M.D.   On: 03/18/2019 08:10   Ct Head Wo Contrast  Result Date: 03/20/2019 CLINICAL DATA:  Lung cancer staging EXAM: CT HEAD WITHOUT CONTRAST TECHNIQUE: Contiguous axial images were obtained from the base of the skull through the vertex without intravenous contrast. COMPARISON:  None. FINDINGS: Brain: There is no mass, hemorrhage or extra-axial collection. The size and configuration of the ventricles and extra-axial CSF spaces are normal. There is hypoattenuation of the white matter, most commonly indicating chronic small vessel disease. Vascular: No abnormal hyperdensity of the major intracranial arteries or dural venous sinuses. No intracranial atherosclerosis. Skull: The visualized skull base, calvarium and extracranial soft tissues are normal. Sinuses/Orbits: No fluid levels or advanced mucosal thickening of the visualized paranasal sinuses. No mastoid or middle ear effusion. The orbits are normal. IMPRESSION: Mild chronic white matter small vessel ischemic changes without acute abnormality. No evidence for intracranial metastatic disease on this noncontrast examination. Electronically Signed   By: Deatra RobinsonKevin  Herman M.D.   On: 03/20/2019 15:49   Ct Head Wo Contrast  Result Date: 12-03-18 CLINICAL DATA:  63 year old with  unexplained acute mental status changes as the patient is unable to speak clearly. Patient also presents with respiratory distress and has widespread abrasions. EXAM: CT HEAD WITHOUT CONTRAST TECHNIQUE: Contiguous axial images were obtained from the base of the skull through the vertex without intravenous contrast. COMPARISON:  None. FINDINGS: Brain: Ventricular system normal in size and appearance for age. Very small old lacunar stroke in the LEFT thalamus. Mild changes of small vessel disease of the white matter diffusely. No mass lesion. No midline shift. No acute hemorrhage or hematoma. No extra-axial fluid collections. No evidence of acute infarction. Vascular: Mild BILATERAL vertebral artery atherosclerosis. No hyperdense vessel. Skull: No skull fracture or other focal osseous abnormality involving the skull. Sinuses/Orbits: Mucous retention cyst or polyp in the RIGHT maxillary sinus. Remaining visualized paranasal sinuses, BILATERAL mastoid air cells and BILATERAL middle ear cavities well-aerated. Visualized orbits and globes unremarkable. Other: None. IMPRESSION: 1. No acute intracranial abnormality. 2. Mild chronic microvascular ischemic changes of the white matter and very small old lacunar stroke in the left thalamus. 3. Mild chronic right maxillary sinus disease. Electronically Signed   By: Hulan Saashomas  Lawrence M.D.   On: 06/29/19 13:35   Ct Chest Wo Contrast  Result Date: 03/20/2019 CLINICAL DATA:  Ventilator dependence.  Decreased breath sounds. EXAM: CT CHEST WITHOUT CONTRAST TECHNIQUE: Multidetector CT imaging of the chest was performed following the standard protocol without IV contrast. COMPARISON:  03/19/2019 chest x-ray FINDINGS: Cardiovascular: The heart size is normal. No substantial pericardial effusion. Coronary artery calcification is evident.  Ascending thoracic aorta measures up to 5.5 cm diameter. Atherosclerotic calcification is noted in the wall of the thoracic aorta. Right central  line tip is positioned in the mid SVC. Mediastinum/Nodes: Endotracheal tube tip visualized in the upper trachea. 11 mm short axis right paratracheal lymph node is mildly enlarged. Hilar regions not well evaluated for lymphadenopathy given collapse/consolidative changes in the parahilar lungs. The esophagus has normal imaging features. There is no axillary lymphadenopathy. Lungs/Pleura: Interstitial and patchy airspace disease is identified in the posterior right upper lobe and right middle lobe with collapse/consolidation of the right lower lobe. Similar mild interstitial and patchy airspace opacity noted posterior left upper lobe with collapse/consolidative change in the left lower lobe. Small to moderate pleural effusions are noted bilaterally. Upper Abdomen: Perihepatic ascites noted in the upper abdomen. Stomach is distended with fluid. Aorta measures 5.2 cm in diameter at the level of the diaphragmatic hiatus. Musculoskeletal: No worrisome lytic or sclerotic osseous abnormality. Multiple nonacute right rib fractures are evident with possible acute nondisplaced fracture of the lateral right fifth rib (91/2). Mild superior endplate compression deformity noted at T5. Patient is status post cervicothoracic fusion. IMPRESSION: 1. Bilateral lower lobe collapse/consolidation with small to moderate bilateral pleural effusions. Interstitial and patchy airspace disease noted in both upper lobes. Imaging features compatible with multifocal pneumonia. 2. Mild right paratracheal lymphadenopathy is likely reactive. 3. Ascending thoracic aorta measures 5.5 cm diameter. Descending aorta measures 5.2 cm diameter at the level of the diaphragmatic hiatus. Recommend semi-annual imaging followup by CTA or MRA and referral to cardiothoracic surgery if not already obtained. This recommendation follows 2010 ACCF/AHA/AATS/ACR/ASA/SCA/SCAI/SIR/STS/SVM Guidelines for the Diagnosis and Management of Patients With Thoracic Aortic Disease.  Circulation. 2010; 121: [id number]. Aortic aneurysm NOS (ICD10-I71.9) Electronically Signed   By: Kennith Center M.D.   On: 03/20/2019 16:10   US Renal  Result Date: 03/17/2019 CLINICAL DATA:  Acute kidney injury EXAM: RENAL / URINARY TRACT ULTRASOUND COMPLETE COMPARISON:  None. FINDINGS: Right Kidney: Renal measurements: 10.6 x 4.7 x 4.4 cm = volume: 114 mL . Echogenicity within normal limits. No mass or hydronephrosis visualized. 15 mm right upper pole cyst. Left Kidney: Renal measurements: 9.9 x 5.3 by 4.7 cm = volume: 127 mL. Echogenicity within normal limits. No mass or hydronephrosis visualized. Bladder: Diffuse bladder wall thickening, mild. Ascites around the liver IMPRESSION: Negative for renal mass or obstruction.  Bladder wall thickening Ascites Electronically Signed   By: Marlan Palau M.D.   On: 03/17/2019 13:44   Mr Elbow Right Wo Contrast  Result Date: 03/17/2019 CLINICAL DATA:  Wound overlying the olecranon process. Evaluate for osteomyelitis. EXAM: MRI OF THE RIGHT ELBOW WITHOUT CONTRAST TECHNIQUE: Multiplanar, multisequence MR imaging of the elbow was performed. No intravenous contrast was administered. COMPARISON:  None. FINDINGS: TENDONS Common forearm flexor origin: Intact. Common forearm extensor origin: Mild tendinosis of the common extensor tendon origin. Biceps: Intact. Triceps: Intact. LIGAMENTS Medial stabilizers: Intact. Lateral stabilizers:  Intact. Cartilage: Small focal area of cartilage loss involving the ulnar aspect of the radial head with subchondral reactive marrow changes. Joint: Large elbow joint effusion. Cubital tunnel: Normal. Bones: Large soft tissue wound overlying the olecranon process extending to the cortex. Single focal area of cortical irregularity and subcortical marrow edema involving the posterior olecranon on (image 14/series 7) concerning for an area of reactive marrow edema versus mild early osteomyelitis. 9.2 x 15 mm fluid collection along the  posterior superficial aspect of the triceps tendon which extends through the triceps tendon into the  posterior fat pad with edema throughout the posterior fat pad. IMPRESSION: 1. Large soft tissue wound overlying the olecranon process extending to the cortex. Single focal area of cortical irregularity and subcortical marrow edema involving the posterior olecranon on (image 14/series 7) concerning for an area of reactive marrow edema versus mild early osteomyelitis. 9.2 x 15 mm fluid collection along the posterior superficial aspect of the triceps tendon which extends through the triceps tendon into the posterior fat pad with edema throughout the posterior fat pad concerning for an abscess. Large elbow joint effusion which may be reactive, but is most concerning for septic arthritis. Electronically Signed   By: Elige Ko   On: 03/17/2019 13:50   Dg Chest Port 1 View  Result Date: 03/22/2019 CLINICAL DATA:  Acute respiratory failure EXAM: PORTABLE CHEST 1 VIEW COMPARISON:  03/21/2019 FINDINGS: Cardiac shadows within normal limits. Endotracheal tube is noted in satisfactory position. Right jugular central line is again seen and stable. Feeding catheter is noted extending into the distal esophagus. It is coiled several times within the neck. Withdrawal and readvancement is recommended. Diffuse bilateral infiltrates and effusions are again seen and stable. No pneumothorax is seen. No bony abnormality is noted. Postsurgical changes in the cervical spine are seen. IMPRESSION: Overall stable appearance of the chest. The weighted feeding catheter is noted within the distal esophagus but coiled several times within the neck. Withdrawal in readvancement is recommended. Electronically Signed   By: Alcide Clever M.D.   On: 03/22/2019 03:57   Dg Chest Port 1 View  Result Date: 03/21/2019 CLINICAL DATA:  ET tube placement EXAM: PORTABLE CHEST 1 VIEW COMPARISON:  03/19/2019 FINDINGS: Interval advancement of endotracheal  tube, tip now projecting over the mid trachea. Weighted enteric feeding tube is positioned with tip above the diaphragm. Metallic stylette is not in position. Unchanged position of right neck vascular catheter, tip over the lower SVC. No significant change in diffuse bilateral interstitial pulmonary opacity and layering bilateral pleural effusions. IMPRESSION: 1. Interval advancement of endotracheal tube, tip now projecting over the mid trachea. 2. Weighted enteric feeding tube is positioned with tip above the diaphragm. Metallic stylette is not in position. Recommend advancement. 3. Unchanged position of right neck vascular catheter, tip over the lower SVC. 4. No significant change in diffuse bilateral interstitial pulmonary opacity and layering bilateral pleural effusions. Electronically Signed   By: Lauralyn Primes M.D.   On: 03/21/2019 14:49   Dg Chest Port 1 View  Result Date: 03/20/2019 CLINICAL DATA:  63 year old male central line placement. EXAM: PORTABLE CHEST 1 VIEW COMPARISON:  0302 hours the same day and earlier. FINDINGS: Portable AP supine view at 2342 hours. Endotracheal tube tip at the level the clavicles. Enteric tube courses to the abdomen, tip not included. New right IJ central line in place, tip at or just below the level of the carina. No pneumothorax. Stable lung volumes. Stable mediastinal contours allowing for differences in rotation. Veiling opacity at the right lung base has increased since 2019/04/06. Pulmonary vascularity has increased. Extensive previous posterior cervical and upper thoracic spine fusion. IMPRESSION: 1. Right IJ central line placed, tip at the SVC level. No pneumothorax. 2. ETT tip at the level the clavicles. Enteric tube courses to the abdomen. 3. Suspect increasing right pleural effusion since 06-Apr-2019, and borderline to mild interstitial edema. Electronically Signed   By: Odessa Fleming M.D.   On: 03/20/2019 00:08   Dg Chest Port 1 View  Result Date:  03/19/2019 CLINICAL DATA:  63  y/o  M; acute respiratory failure. EXAM: PORTABLE CHEST 1 VIEW COMPARISON:  03/18/2019 chest radiograph. FINDINGS: Stable cardiac silhouette and uncoiled aorta given oblique technique. Endotracheal tube tip projects 6.5 cm above the carina. Enteric tube tip extends below the field of view into the abdomen. Increasing basilar infiltrates greater on the left. Possible small effusions. No pneumothorax. Cervical fusion hardware, partially visualized. No acute osseous abnormality is evident. IMPRESSION: Stable support apparatus. Increasing basilar infiltrates greater on the left. Possible small effusions. Electronically Signed   By: Mitzi Hansen M.D.   On: 03/19/2019 03:58   Dg Chest Port 1 View  Result Date: 03/18/2019 CLINICAL DATA:  Status post intubation. EXAM: PORTABLE CHEST 1 VIEW COMPARISON:  Radiograph of 2019/03/21. FINDINGS: Stable cardiomediastinal silhouette. Endotracheal and nasogastric tubes are in grossly good position. No pneumothorax is noted. Left lung is clear. Minimal right basilar subsegmental atelectasis or infiltrate is noted. Bony thorax is unremarkable. IMPRESSION: Endotracheal and nasogastric tubes are in grossly good position. Minimal right basilar subsegmental atelectasis or infiltrate. Electronically Signed   By: Lupita Raider M.D.   On: 03/18/2019 08:09   Dg Chest Port 1 View  Result Date: 03/21/19 CLINICAL DATA:  Sepsis. EXAM: PORTABLE CHEST 1 VIEW COMPARISON:  None FINDINGS: The thoracic aorta is prominent tortuous. The heart, hila, and mediastinum are unremarkable. The left lung is clear. Mild opacity in the right mid lung. Mild atelectasis in the right base. No other acute abnormalities. IMPRESSION: 1. Possible mild opacity in the right mid lung could represent subtle pneumonia. A better volume PA and lateral chest x-ray may better evaluate. 2. Prominent tortuous thoracic aorta. Electronically Signed   By: Gerome Sam III M.D    On: 03/21/2019 12:58   Dg Abd Portable 1v  Result Date: 03/19/2019 CLINICAL DATA:  63 year old male tube placement. EXAM: PORTABLE ABDOMEN - 1 VIEW COMPARISON:  1535 hours today. FINDINGS: Portable AP semi upright view at 1749 hours. The NG tube has been removed and enteric feeding tube has been placed. The tip projects in the medial lower abdomen, likely within the distal aspect of a ptotic stomach. There probably is adequate slack to allow for post pyloric transit. Otherwise stable abdomen and visible lower chest. IMPRESSION: Feeding tube placed, tip likely in the distal aspect of a ptotic stomach. Electronically Signed   By: Odessa Fleming M.D.   On: 03/19/2019 19:59   Dg Abd Portable 1v  Result Date: 03/19/2019 CLINICAL DATA:  OG tube placement EXAM: PORTABLE ABDOMEN - 1 VIEW COMPARISON:  Earlier same day; 03/18/2019 FINDINGS: Interval advancement of enteric tube with side port now projected the expected location of the GE junction, similar to the 02/2018 examination. Gaseous distention of the colon and cecum. No pneumoperitoneum, pneumatosis or portal venous gas. Limited visualization of the lower thorax demonstrates suspected trace bilateral effusions with associated bibasilar opacities. Mild scoliotic curvature of the thoracolumbar spine, potentially positional. IMPRESSION: Interval advancement of enteric tube with side port now projected over the expected location of the GE junction. Advancement an additional 8 cm could be performed as indicated. Electronically Signed   By: Simonne Come M.D.   On: 03/19/2019 16:11   Dg Abd Portable 1v  Result Date: 03/19/2019 CLINICAL DATA:  Ileus. EXAM: PORTABLE ABDOMEN - 1 VIEW COMPARISON:  03/18/2019 FINDINGS: There is gaseous distention of multiple loops of small bowel and colon. There is a relative paucity of bowel gas at the level of the rectum. The nasogastric tube is suboptimally positioned in  the esophagus. Bibasilar airspace opacities are noted IMPRESSION: 1.  Nasogastric tube in the esophagus.  Repositioning is recommended. 2. Persistent mildly dilated loops of small bowel. 3. Bibasilar airspace opacities. Consider dedicated chest radiograph for further evaluation. Electronically Signed   By: Katherine Mantlehristopher  Green M.D.   On: 03/19/2019 13:41   Dg Abd Portable 1v  Result Date: 03/17/2019 CLINICAL DATA:  Screening for metallic foreign bodies prior to MRI EXAM: PORTABLE ABDOMEN - 1 VIEW COMPARISON:  None. FINDINGS: The bowel gas pattern is normal. No radio-opaque calculi or other significant radiographic abnormality are seen. No definitive metallic foreign body is noted. Degenerative changes of lumbar spine are noted. IMPRESSION: No definitive metallic foreign body is identified. Electronically Signed   By: Alcide CleverMark  Lukens M.D.   On: 03/17/2019 10:08    Microbiology Recent Results (from the past 240 hour(s))  Blood Culture (routine x 2)     Status: Abnormal   Collection Time: 02/20/2019 12:16 PM  Result Value Ref Range Status   Specimen Description   Final    BLOOD BLOOD RIGHT FOREARM Performed at [facility], [address] [place], [place] [address]    Special Requests   Final    BOTTLES DRAWN AEROBIC AND ANAEROBIC Blood Culture results may not be optimal due to an excessive volume of blood received in culture bottles Performed at [facility], [address] [place], [place] [address]    Culture  Setup Time   Final    GRAM POSITIVE COCCI IN BOTH AEROBIC AND ANAEROBIC BOTTLES CRITICAL RESULT CALLED TO, READ BACK BY AND VERIFIED WITH: JASON ROBBINS ON 03/17/2019 AT 0012 QSD Performed at [facility], [address] [place], [place] [address]    Culture (A)  Final    STAPHYLOCOCCUS AUREUS SUSCEPTIBILITIES PERFORMED ON PREVIOUS CULTURE WITHIN THE LAST 5 DAYS. Performed at [facility], [address] [place], [place] [address]    Report Status 03/19/2019 FINAL  Final  Blood Culture (routine x 2)     Status:  Abnormal   Collection Time: 02/20/2019 12:16 PM  Result Value Ref Range Status   Specimen Description   Final    BLOOD LAC Performed at [facility], [address] [place], [place] [address]    Special Requests   Final    BOTTLES DRAWN AEROBIC AND ANAEROBIC Blood Culture results may not be optimal due to an excessive volume of blood received in culture bottles Performed at [facility], [address] [place], [place] [address]    Culture  Setup Time   Final    GRAM POSITIVE COCCI IN BOTH AEROBIC AND ANAEROBIC BOTTLES CRITICAL RESULT CALLED TO, READ BACK BY AND VERIFIED WITH: JASON ROBBINS ON 03/17/2019 AT 0012 QSD Performed at [facility], [address] [place], [place] [address]    Culture METHICILLIN RESISTANT STAPHYLOCOCCUS AUREUS (A)  Final   Report Status 03/19/2019 FINAL  Final   Organism ID, Bacteria METHICILLIN RESISTANT STAPHYLOCOCCUS AUREUS  Final      Susceptibility   Methicillin resistant staphylococcus aureus - MIC*    CIPROFLOXACIN <=0.5 SENSITIVE Sensitive     ERYTHROMYCIN <=0.25 SENSITIVE Sensitive     GENTAMICIN <=0.5 SENSITIVE Sensitive     OXACILLIN >=4 RESISTANT Resistant     TETRACYCLINE <=1 SENSITIVE Sensitive     VANCOMYCIN 1 SENSITIVE Sensitive     TRIMETH/SULFA <=10 SENSITIVE Sensitive     CLINDAMYCIN <=0.25 SENSITIVE Sensitive     RIFAMPIN <=0.5 SENSITIVE  Sensitive     Inducible Clindamycin NEGATIVE Sensitive     * METHICILLIN RESISTANT STAPHYLOCOCCUS AUREUS  Urine culture     Status: Abnormal   Collection Time: 03-31-19 12:16 PM  Result Value Ref Range Status   Specimen Description   Final    URINE, RANDOM Performed at [facility], [address] [place], [place] [address]    Special Requests   Final    NONE Performed at [facility], [address] [place], [place] [address]    Culture (A)  Final    >=100,000 COLONIES/mL METHICILLIN RESISTANT STAPHYLOCOCCUS AUREUS   Report Status 03/18/2019  FINAL  Final   Organism ID, Bacteria METHICILLIN RESISTANT STAPHYLOCOCCUS AUREUS (A)  Final      Susceptibility   Methicillin resistant staphylococcus aureus - MIC*    CIPROFLOXACIN <=0.5 SENSITIVE Sensitive     GENTAMICIN <=0.5 SENSITIVE Sensitive     NITROFURANTOIN 32 SENSITIVE Sensitive     OXACILLIN >=4 RESISTANT Resistant     TETRACYCLINE <=1 SENSITIVE Sensitive     VANCOMYCIN 1 SENSITIVE Sensitive     TRIMETH/SULFA <=10 SENSITIVE Sensitive     CLINDAMYCIN <=0.25 SENSITIVE Sensitive     RIFAMPIN <=0.5 SENSITIVE Sensitive     Inducible Clindamycin NEGATIVE Sensitive     * >=100,000 COLONIES/mL METHICILLIN RESISTANT STAPHYLOCOCCUS AUREUS  SARS Coronavirus 2 (CEPHEID- Performed in [facility] hospital lab), Hosp Order     Status: None   Collection Time: 03/31/2019 12:16 PM  Result Value Ref Range Status   SARS Coronavirus 2 NEGATIVE NEGATIVE Final    Comment: (NOTE) If result is NEGATIVE SARS-CoV-2 target nucleic acids are NOT DETECTED. The SARS-CoV-2 RNA is generally detectable in upper and lower  respiratory specimens during the acute phase of infection. The lowest  concentration of SARS-CoV-2 viral copies this assay can detect is 250  copies / mL. A negative result does not preclude SARS-CoV-2 infection  and should not be used as the sole basis for treatment or other  patient management decisions.  A negative result may occur with  improper specimen collection / handling, submission of specimen other  than nasopharyngeal swab, presence of viral mutation(s) within the  areas targeted by this assay, and inadequate number of viral copies  (<250 copies / mL). A negative result must be combined with clinical  observations, patient history, and epidemiological information. If result is POSITIVE SARS-CoV-2 target nucleic acids are DETECTED. The SARS-CoV-2 RNA is generally detectable in upper and lower  respiratory specimens dur ing the acute phase of infection.  Positive  results  are indicative of active infection with SARS-CoV-2.  Clinical  correlation with patient history and other diagnostic information is  necessary to determine patient infection status.  Positive results do  not rule out bacterial infection or co-infection with other viruses. If result is PRESUMPTIVE POSTIVE SARS-CoV-2 nucleic acids MAY BE PRESENT.   A presumptive positive result was obtained on the submitted specimen  and confirmed on repeat testing.  While 2019 novel coronavirus  (SARS-CoV-2) nucleic acids may be present in the submitted sample  additional confirmatory testing may be necessary for epidemiological  and / or clinical management purposes  to differentiate between  SARS-CoV-2 and other Sarbecovirus currently known to infect humans.  If clinically indicated additional testing with an alternate test  methodology [id number]) is advised. The SARS-CoV-2 RNA is generally  detectable in upper and lower respiratory sp ecimens during the acute  phase of infection. The expected result is Negative. Fact  Sheet for Patients:  BoilerBrush.com.cy Fact Sheet for Healthcare Providers: https://pope.com/ This test is not yet approved or cleared by the [place] FDA and has been authorized for detection and/or diagnosis of SARS-CoV-2 by FDA under an Emergency Use Authorization (EUA).  This EUA will remain in effect (meaning this test can be used) for the duration of the COVID-19 declaration under Section 564(b)(1) of the Act, 21 U.S.C. section 360bbb-3(b)(1), unless the authorization is terminated or revoked sooner. Performed at [facility], [address] [place], [place] [address]   Blood Culture ID Panel (Reflexed)     Status: Abnormal   Collection Time: Apr 11, 2019 12:16 PM  Result Value Ref Range Status   Enterococcus species NOT DETECTED NOT DETECTED Final   Listeria monocytogenes NOT DETECTED NOT DETECTED Final    Staphylococcus species DETECTED (A) NOT DETECTED Final    Comment: CRITICAL RESULT CALLED TO, READ BACK BY AND VERIFIED WITH: JASON ROBBINS ON 03/17/2019 AT 0012 QSD    Staphylococcus aureus (BCID) DETECTED (A) NOT DETECTED Final    Comment: Methicillin (oxacillin)-resistant Staphylococcus aureus (MRSA). MRSA is predictably resistant to beta-lactam antibiotics (except ceftaroline). Preferred therapy is vancomycin unless clinically contraindicated. Patient requires contact precautions if  hospitalized. CRITICAL RESULT CALLED TO, READ BACK BY AND VERIFIED WITH: JASON ROBBINS ON 03/17/2019 AT 0012 QSD    Methicillin resistance DETECTED (A) NOT DETECTED Final    Comment: CRITICAL RESULT CALLED TO, READ BACK BY AND VERIFIED WITH: JASON ROBBINS ON 03/17/2019 AT 0012 QSD    Streptococcus species NOT DETECTED NOT DETECTED Final   Streptococcus agalactiae NOT DETECTED NOT DETECTED Final   Streptococcus pneumoniae NOT DETECTED NOT DETECTED Final   Streptococcus pyogenes NOT DETECTED NOT DETECTED Final   Acinetobacter baumannii NOT DETECTED NOT DETECTED Final   Enterobacteriaceae species NOT DETECTED NOT DETECTED Final   Enterobacter cloacae complex NOT DETECTED NOT DETECTED Final   Escherichia coli NOT DETECTED NOT DETECTED Final   Klebsiella oxytoca NOT DETECTED NOT DETECTED Final   Klebsiella pneumoniae NOT DETECTED NOT DETECTED Final   Proteus species NOT DETECTED NOT DETECTED Final   Serratia marcescens NOT DETECTED NOT DETECTED Final   Haemophilus influenzae NOT DETECTED NOT DETECTED Final   Neisseria meningitidis NOT DETECTED NOT DETECTED Final   Pseudomonas aeruginosa NOT DETECTED NOT DETECTED Final   Candida albicans NOT DETECTED NOT DETECTED Final   Candida glabrata NOT DETECTED NOT DETECTED Final   Candida krusei NOT DETECTED NOT DETECTED Final   Candida parapsilosis NOT DETECTED NOT DETECTED Final   Candida tropicalis NOT DETECTED NOT DETECTED Final    Comment: Performed at [facility], [address] [place], [place] [address]  MRSA PCR Screening     Status: Abnormal   Collection Time: 04/11/19  6:16 PM  Result Value Ref Range Status   MRSA by PCR POSITIVE (A) NEGATIVE Final    Comment:        The GeneXpert MRSA Assay (FDA approved for NASAL specimens only), is one component of a comprehensive MRSA colonization surveillance program. It is not intended to diagnose MRSA infection nor to guide or monitor treatment for MRSA infections. RESULT CALLED TO, READ BACK BY AND VERIFIED WITH: PREDOME,F AT 2001 ON 2019-04-11 BY MOSLEY,J Performed at [facility], [address] [place], [place] [address]   C difficile quick scan w PCR reflex     Status: Abnormal   Collection Time: 03/17/19  4:45 AM  Result Value Ref Range Status   C Diff antigen  POSITIVE (A) NEGATIVE Final   C Diff toxin POSITIVE (A) NEGATIVE Corrected    Comment: CRITICAL RESULT CALLED TO, READ BACK BY AND VERIFIED WITH: FELICIA PREUDHOMME  03/17/19 [facility] CORRECTED ON 05/19 AT 0853: PREVIOUSLY REPORTED AS POSITIVE RESULT CALLED TO, READ BACK BY AND VERIFIED WITH: FELICIA PREUDHOMME  03/17/19 AKT    C Diff interpretation Toxin producing C. difficile detected.  Final    Comment: Performed at [facility], [address] [place], [place] [address]  CULTURE, BLOOD (ROUTINE X 2) w Reflex to ID Panel     Status: None (Preliminary result)   Collection Time: 03/18/19 10:38 AM  Result Value Ref Range Status   Specimen Description BLOOD RIGHT HAND  Final   Special Requests   Final    BOTTLES DRAWN AEROBIC AND ANAEROBIC Blood Culture adequate volume   Culture   Final    NO GROWTH 4 DAYS Performed at [facility], [address], [place], [place] [address]    Report Status PENDING  Incomplete  CULTURE, BLOOD (ROUTINE X 2) w Reflex to ID Panel     Status: None (Preliminary result)   Collection Time: 03/18/19 10:38 AM  Result Value Ref Range Status   Specimen  Description BLOOD RIGHT WRIST  Final   Special Requests   Final    BOTTLES DRAWN AEROBIC AND ANAEROBIC Blood Culture adequate volume   Culture   Final    NO GROWTH 4 DAYS Performed at [facility], [address], [place], [place] [address]    Report Status PENDING  Incomplete  Aerobic Culture (superficial specimen)     Status: None (Preliminary result)   Collection Time: 03/20/19 11:46 PM  Result Value Ref Range Status   Specimen Description   Final    TOE LEFT Performed at [facility], [address] [place], [place] [address]    Special Requests   Final    NONE Performed at [facility], [address] [place], [place] [address]    Gram Stain   Final    RARE WBC PRESENT, PREDOMINANTLY PMN RARE GRAM POSITIVE COCCI IN PAIRS    Culture   Final    CULTURE REINCUBATED FOR BETTER GROWTH Performed at [facility], [address], [place], [place] [address]    Report Status PENDING  Incomplete  Culture, respiratory (non-expectorated)     Status: None (Preliminary result)   Collection Time: 03/22/19  7:31 AM  Result Value Ref Range Status   Specimen Description   Final    TRACHEAL ASPIRATE Performed at [facility], [address] [place], [place] [address]    Special Requests   Final    NONE Performed at [facility], [address] [place], [place] [address]    Gram Stain   Final    RARE WBC PRESENT,BOTH PMN AND MONONUCLEAR FEW YEAST RARE SQUAMOUS EPITHELIAL CELLS PRESENT Performed at [facility], [address] [place], [place] [address]    Culture PENDING  Incomplete   Report Status PENDING  Incomplete    Lab Basic Metabolic Panel: Recent Labs  Lab 03/17/19 0538  03/17/19 2047 03/18/19 0422 03/19/19 0243 03/20/19 0425 03/21/19 0530 03/22/19 0439  NA 134*  --   --  136 135 138 135 138  K 2.3*   < > 2.8* 3.7 3.8 4.0 4.1 4.1  CL 104  --   --  106 101 105 103 106  CO2 16*  --   --  16* GLUCOSE 92  --   --  80 84 210* 140* 90  BUN 60*  --   --  55* 51* 50* 56* 65*  CREATININE 3.56*  --   --  2.77* 2.32* 2.36* 2.15* 2.00*  CALCIUM 7.2*  --   --  7.4* 7.3* 7.7* 7.6* 7.7*  MG 2.1  --  2.0 1.9  --  1.8 1.9 1.6*  PHOS 4.3  --   --  2.7  --  4.2  --  3.4   < > = values in this interval not displayed.   Liver Function Tests: Recent Labs  Lab 02/28/2019 1216 03/22/19 0439  AST 57* 18  ALT 18 14  ALKPHOS 123 134*  BILITOT 0.7 0.4  PROT 5.1* 4.0*  ALBUMIN 1.7* 1.1*   No results for input(s): LIPASE, AMYLASE in the last 168 hours. No results for input(s): AMMONIA in the last 168 hours. CBC: Recent Labs  Lab 03/11/2019 1216  03/18/19 0422 03/19/19 0243 03/20/19 0425 03/21/19 0530 03/22/19 0439  WBC 25.6*   < > 21.6* 19.6* 19.2* 11.3* 7.5  NEUTROABS 23.5*  --  19.5*  --   --  9.5* 6.3  HGB 9.2*   < > 8.3* 8.5* 7.6* 8.2* 7.9*  HCT 27.4*   < > 23.7* 25.1* 23.2* 25.6* 26.3*  MCV 92.6   < > 89.1 90.6 94.3 96.6 101.2*  PLT 151   < > 90* 77* 67* 74* 55*   < > = values in this interval not displayed.   Cardiac Enzymes: No results for input(s): CKTOTAL, CKMB, CKMBINDEX, TROPONINI in the last 168 hours. Sepsis Labs: Recent Labs  Lab 03/15/2019 1216 03/12/2019 1502 03/24/2019 1938  03/19/19 0243 03/20/19 0425 03/21/19 0530 03/22/19 0439  PROCALCITON 3.88  --   --   --   --   --   --   --   WBC 25.6*  --   --    < > 19.6* 19.2* 11.3* 7.5  LATICACIDVEN >11.0* 5.7* 1.9  --   --   --   --  2.6*   < > = values in this interval not displayed.    Procedures/Operations  TEE Wound VAC placement Central venous catheter placement   Magddalene S Tukov-Yual Mar 26, 2019, 3:43 AM

## 2019-03-31 NOTE — Progress Notes (Signed)
Friend did not indicate he would be coming to the hospital, because he stated that "they would contact me tomorrow for information" and he was told yes, but called after patient was in morgue and asked if he could see patient, and was told patient was in the morgue. Aware that Administrative Coordinator will contact him later for more information.

## 2019-03-31 NOTE — Progress Notes (Signed)
Spoke to NP that sugar did not improve after D50 still in 40s., BP is low, oxygen sats low. On three pressors. NP said to start D10 at 30 and LR change to 70. Riki Rusk patient's friend was called to give an update.

## 2019-03-31 NOTE — Telephone Encounter (Signed)
Death Certificate completed. Place up front for pickup. [employer] aware.

## 2019-03-31 NOTE — Progress Notes (Signed)
Patient's vital signs ceased and patient was pronounced at 0110 by two RNs. NP aware. Leonard Peters was informed and said he knows someone will be in touch with him in the AM.

## 2019-03-31 NOTE — Telephone Encounter (Signed)
Death Certificate received, placed in LG box for completion.  

## 2019-03-31 DEATH — deceased

## 2019-04-04 ENCOUNTER — Telehealth: Payer: Self-pay | Admitting: Pulmonary Disease

## 2019-04-04 NOTE — Telephone Encounter (Signed)
Death certificate received by [facility] service. Placed in inbox up front.

## 2019-04-04 NOTE — Telephone Encounter (Signed)
Death certificate was received and completed on 03/31/2019. I have attempted to contact [employer] to see if or why duplicate is needed. Did not received dial tone, will call back.

## 2019-04-04 NOTE — Telephone Encounter (Signed)
Death certificate has been placed in Dr. Gonzalez's folder.   

## 2019-04-07 NOTE — Telephone Encounter (Signed)
Called and spoke to Leonard Peters with [employer].  He stated that register of deeds would not accept previous death certificate, due to it having water spot that smeared ink.

## 2019-04-09 NOTE — Telephone Encounter (Signed)
Death Certificate completed. [employer] aware.  

## 2020-08-06 IMAGING — DX LEFT ELBOW - 2 VIEW
2 series · 2 of 2 positions shown · non-contrast
Comparison: None.

CLINICAL DATA: Pain with movement

EXAM:
LEFT ELBOW - 2 VIEW

[elbow ap]
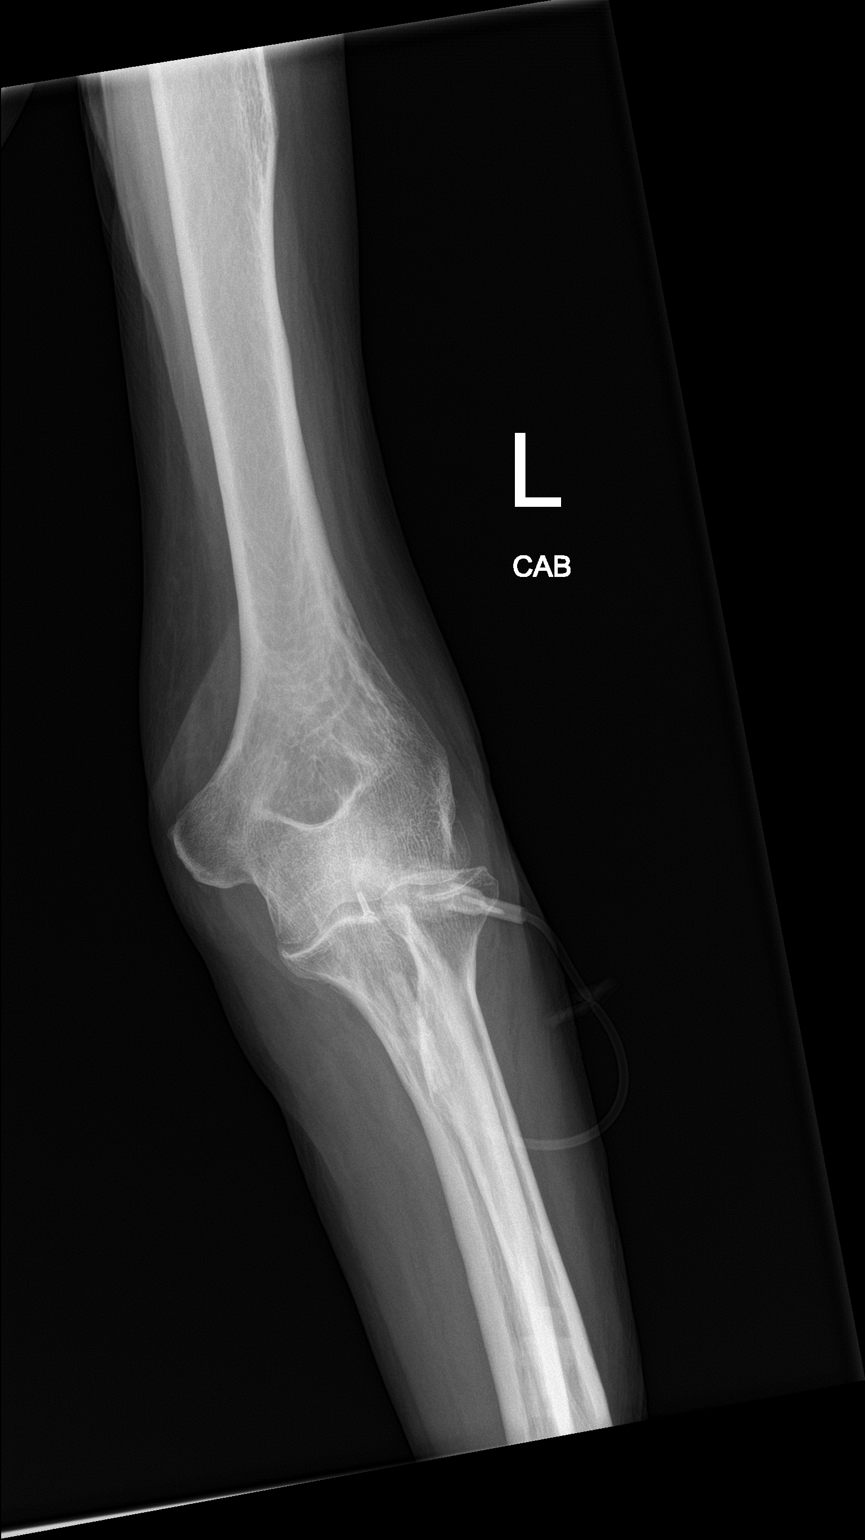

[elbow lat]
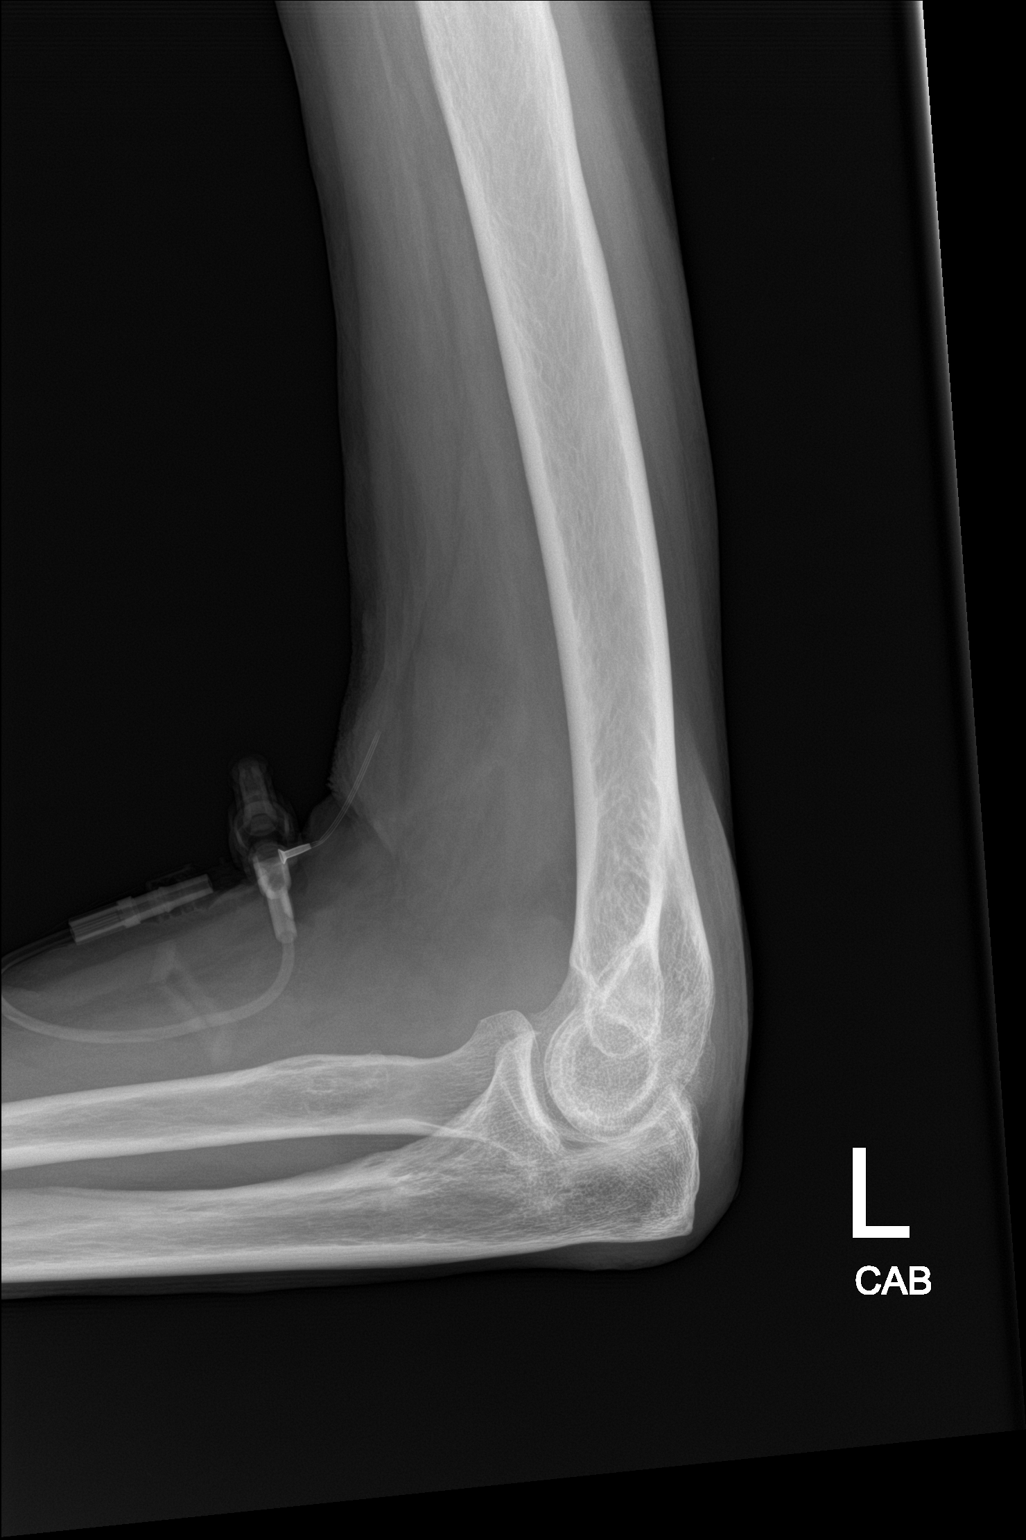

[2 of 2 positions shown; findings below may reference images not displayed]

FINDINGS: The joint effusion is identified with displacement of the anterior
fat pad. No fractures is identified. No bony erosion or bony lesion.
IMPRESSION: Joint effusion of uncertain etiology.  No bony abnormality noted.

## 2020-08-06 IMAGING — DX RIGHT ANKLE - 2 VIEW
2 series · 2 of 2 positions shown · non-contrast
Comparison: None.

CLINICAL DATA: Evaluate for sepsis.

EXAM:
RIGHT ANKLE - 2 VIEW

[foot ap]
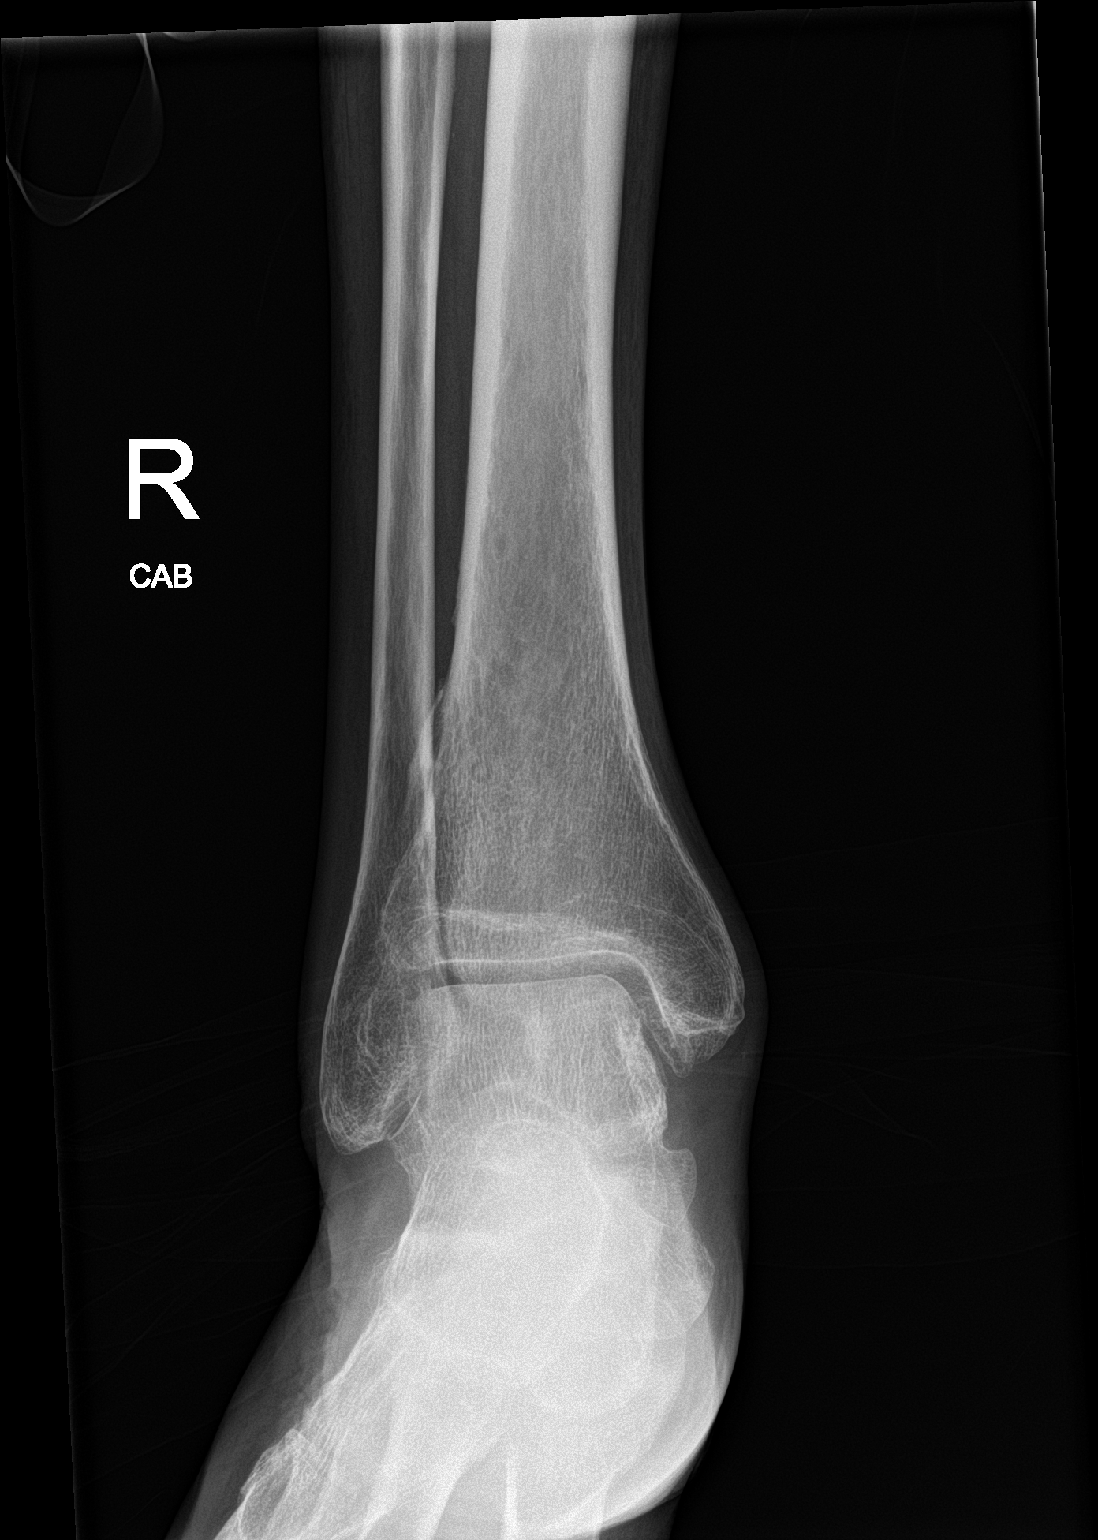

[foot lat]
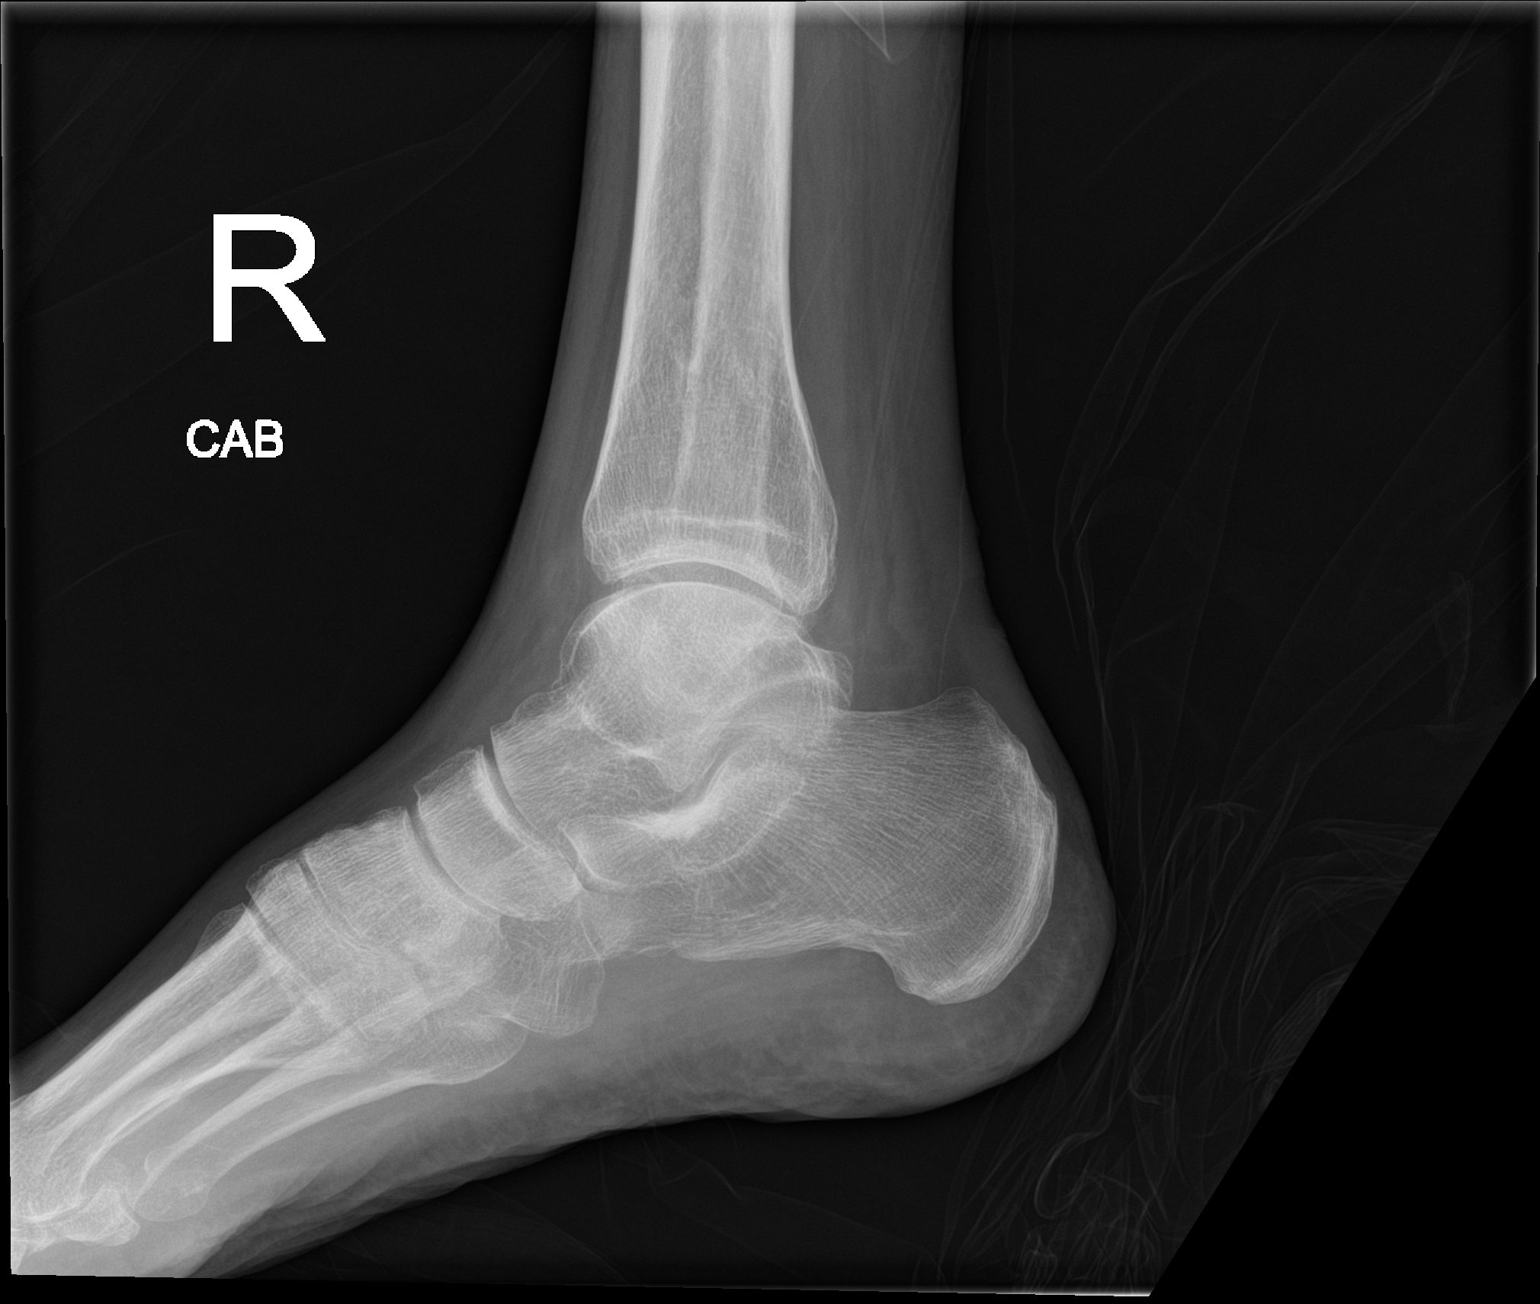

[2 of 2 positions shown; findings below may reference images not displayed]

FINDINGS: No fracture or dislocation. The ankle mortise is intact. Reported
skin defect over the medial surface of the right ankle is not
visualized on this study. No bony erosion or evidence of
osteomyelitis.
IMPRESSION: No evidence of osteomyelitis.

## 2020-08-07 IMAGING — DX PORTABLE ABDOMEN - 1 VIEW
2 series · 2 of 2 positions shown · non-contrast
Comparison: None.

CLINICAL DATA: Screening for metallic foreign bodies prior to MRI

EXAM:
PORTABLE ABDOMEN - 1 VIEW

[abdomen supine (1 of 2)]
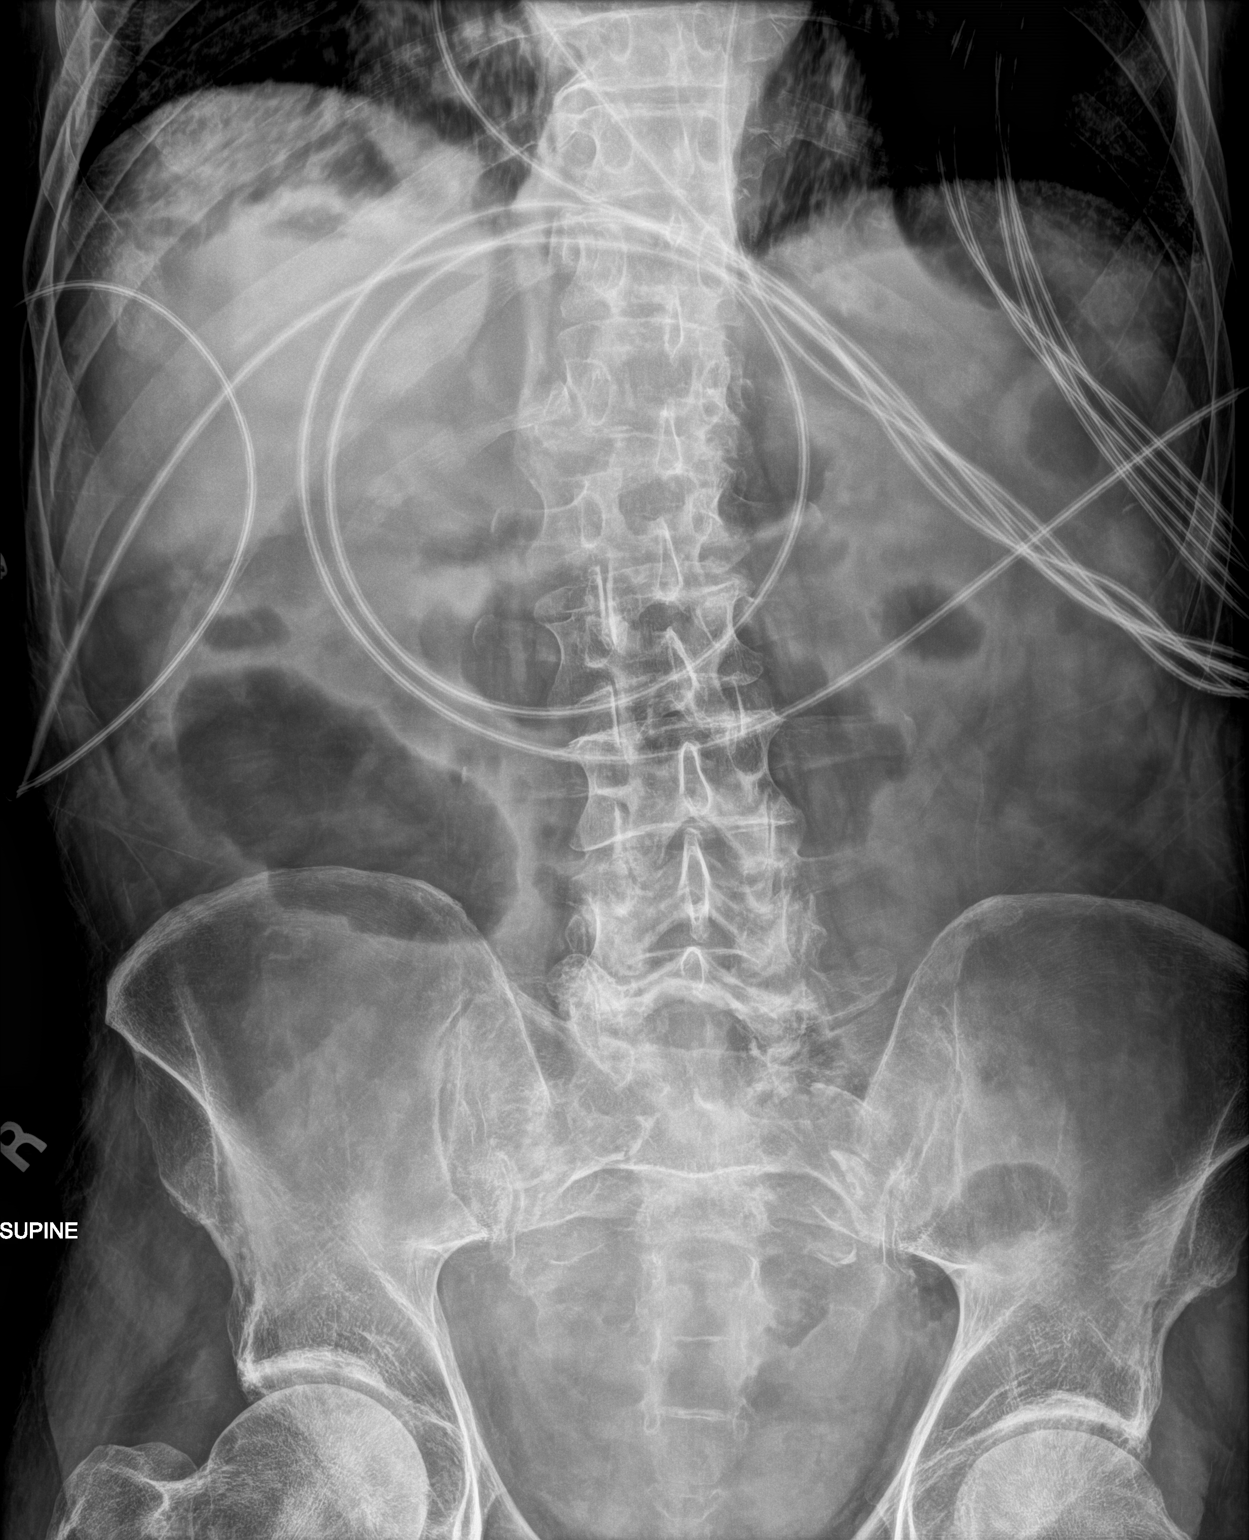

[abdomen supine (2 of 2)]
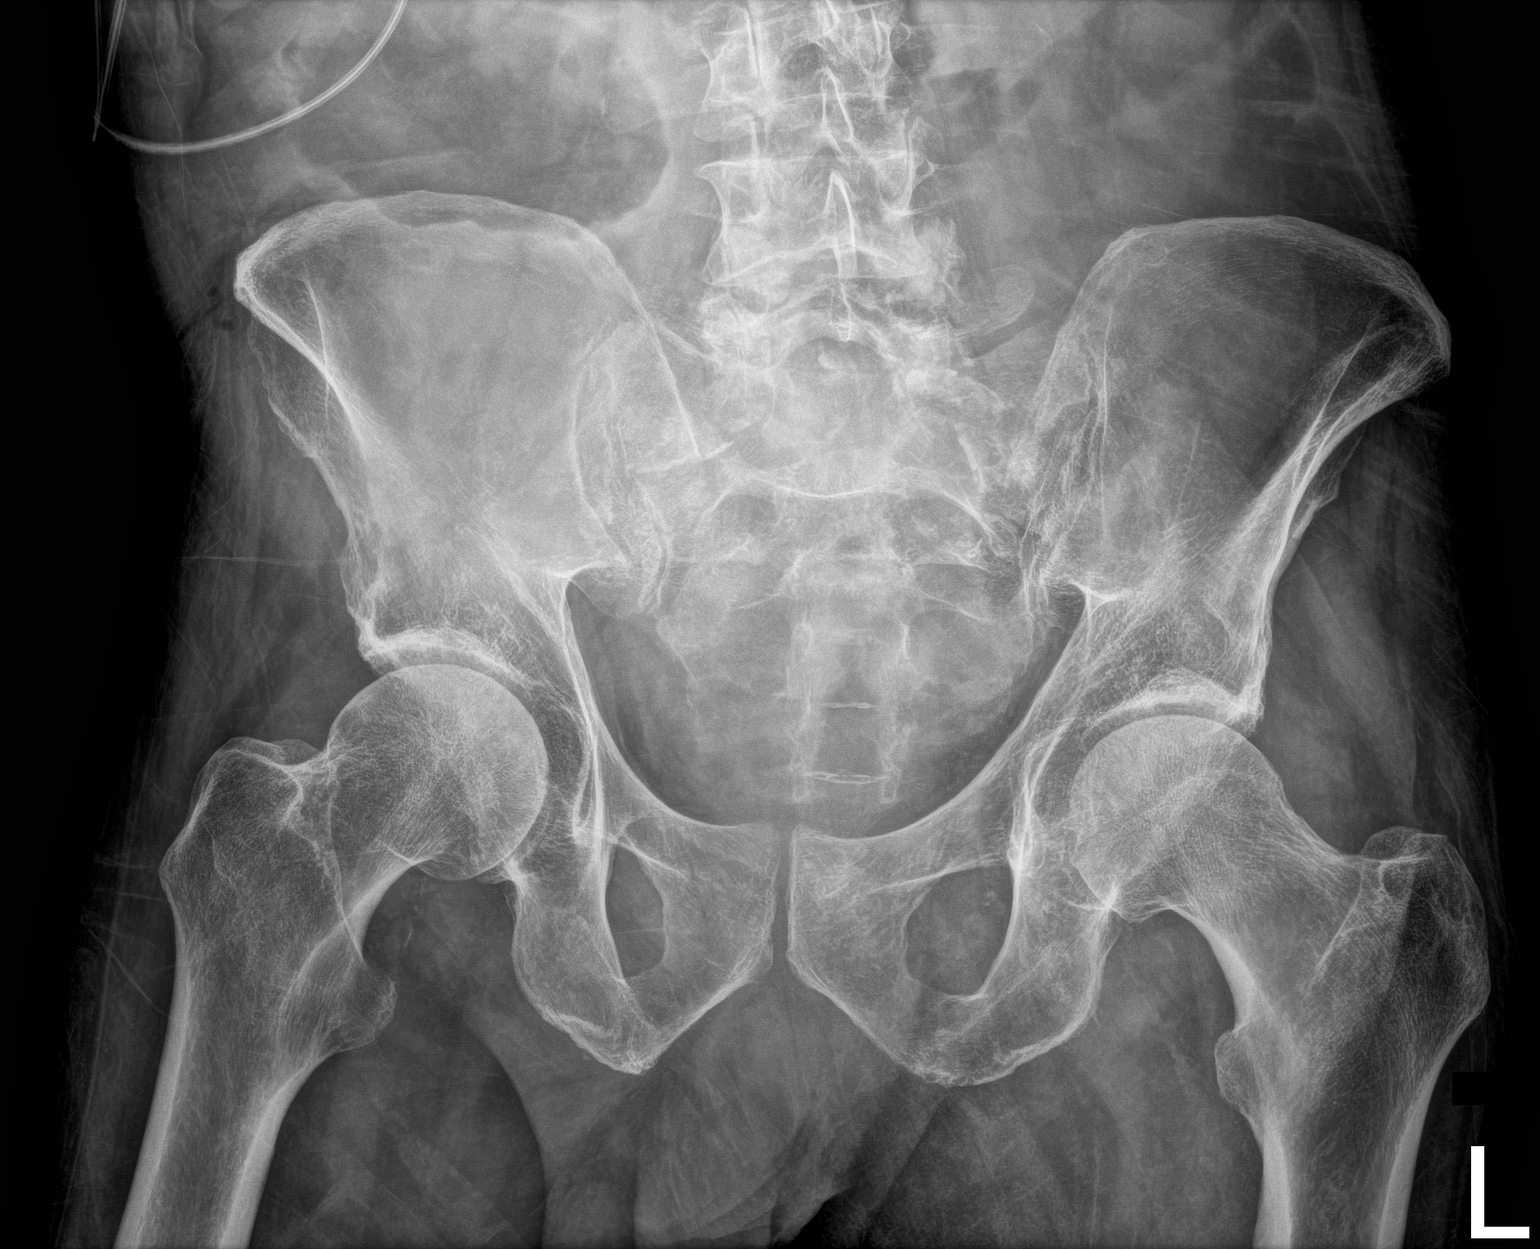

[2 of 2 positions shown; findings below may reference images not displayed]

FINDINGS: The bowel gas pattern is normal. No radio-opaque calculi or other
significant radiographic abnormality are seen. No definitive
metallic foreign body is noted. Degenerative changes of lumbar spine
are noted.
IMPRESSION: No definitive metallic foreign body is identified.
